# Patient Record
Sex: Male | Born: 1950 | Race: White | Hispanic: No | Marital: Married | Smoking: Never smoker
Health system: Southern US, Community
[De-identification: ages and names within clinical notes are randomized; demographics above are authoritative.]

## PROBLEM LIST (undated history)

## (undated) DIAGNOSIS — B001 Herpesviral vesicular dermatitis: Secondary | ICD-10-CM

## (undated) DIAGNOSIS — K219 Gastro-esophageal reflux disease without esophagitis: Secondary | ICD-10-CM

## (undated) DIAGNOSIS — E119 Type 2 diabetes mellitus without complications: Secondary | ICD-10-CM

## (undated) DIAGNOSIS — I10 Essential (primary) hypertension: Secondary | ICD-10-CM

## (undated) DIAGNOSIS — I739 Peripheral vascular disease, unspecified: Secondary | ICD-10-CM

## (undated) DIAGNOSIS — I2510 Atherosclerotic heart disease of native coronary artery without angina pectoris: Secondary | ICD-10-CM

## (undated) DIAGNOSIS — M109 Gout, unspecified: Secondary | ICD-10-CM

## (undated) DIAGNOSIS — M1990 Unspecified osteoarthritis, unspecified site: Secondary | ICD-10-CM

## (undated) DIAGNOSIS — R945 Abnormal results of liver function studies: Secondary | ICD-10-CM

## (undated) DIAGNOSIS — K7460 Unspecified cirrhosis of liver: Secondary | ICD-10-CM

## (undated) DIAGNOSIS — R7989 Other specified abnormal findings of blood chemistry: Secondary | ICD-10-CM

## (undated) DIAGNOSIS — E785 Hyperlipidemia, unspecified: Secondary | ICD-10-CM

## (undated) HISTORY — PX: CHOLECYSTECTOMY: SHX55

## (undated) HISTORY — PX: COLONOSCOPY: SHX174

## (undated) HISTORY — DX: Gastro-esophageal reflux disease without esophagitis: K21.9

## (undated) HISTORY — PX: ROTATOR CUFF REPAIR: SHX139

---

## 1998-11-14 ENCOUNTER — Ambulatory Visit (HOSPITAL_COMMUNITY): Admission: RE | Admit: 1998-11-14 | Discharge: 1998-11-14 | Payer: Self-pay | Admitting: Orthopedic Surgery

## 1998-11-14 ENCOUNTER — Encounter: Payer: Self-pay | Admitting: Orthopedic Surgery

## 2008-01-18 ENCOUNTER — Emergency Department: Payer: Self-pay | Admitting: Emergency Medicine

## 2013-04-29 ENCOUNTER — Emergency Department: Payer: Self-pay | Admitting: Unknown Physician Specialty

## 2013-05-02 ENCOUNTER — Encounter: Payer: Self-pay | Admitting: Family Medicine

## 2013-05-10 ENCOUNTER — Encounter: Payer: Self-pay | Admitting: Family Medicine

## 2013-06-09 ENCOUNTER — Encounter: Payer: Self-pay | Admitting: Family Medicine

## 2013-10-09 ENCOUNTER — Ambulatory Visit: Payer: Self-pay | Admitting: Podiatry

## 2014-09-05 DIAGNOSIS — R945 Abnormal results of liver function studies: Secondary | ICD-10-CM | POA: Insufficient documentation

## 2014-09-05 DIAGNOSIS — I10 Essential (primary) hypertension: Secondary | ICD-10-CM | POA: Insufficient documentation

## 2014-09-05 DIAGNOSIS — E781 Pure hyperglyceridemia: Secondary | ICD-10-CM | POA: Insufficient documentation

## 2014-09-05 DIAGNOSIS — R7989 Other specified abnormal findings of blood chemistry: Secondary | ICD-10-CM | POA: Insufficient documentation

## 2014-09-05 DIAGNOSIS — K219 Gastro-esophageal reflux disease without esophagitis: Secondary | ICD-10-CM | POA: Insufficient documentation

## 2014-09-05 DIAGNOSIS — E1122 Type 2 diabetes mellitus with diabetic chronic kidney disease: Secondary | ICD-10-CM | POA: Insufficient documentation

## 2014-09-23 DIAGNOSIS — I739 Peripheral vascular disease, unspecified: Secondary | ICD-10-CM | POA: Insufficient documentation

## 2015-04-01 ENCOUNTER — Ambulatory Visit
Admit: 2015-04-01 | Disposition: A | Discharge status: Home / Self Care | Payer: Self-pay | Attending: Gastroenterology | Admitting: Gastroenterology

## 2015-06-02 DIAGNOSIS — R7989 Other specified abnormal findings of blood chemistry: Secondary | ICD-10-CM | POA: Insufficient documentation

## 2017-03-10 HISTORY — PX: MOUTH SURGERY: SHX715

## 2017-03-11 ENCOUNTER — Encounter: Payer: Self-pay | Admitting: Emergency Medicine

## 2017-03-11 ENCOUNTER — Emergency Department
Admission: EM | Admit: 2017-03-11 | Discharge: 2017-03-11 | Disposition: A | Discharge status: Home / Self Care | Payer: Medicare Other | Attending: Emergency Medicine | Admitting: Emergency Medicine

## 2017-03-11 ENCOUNTER — Emergency Department: Payer: Medicare Other

## 2017-03-11 DIAGNOSIS — R739 Hyperglycemia, unspecified: Secondary | ICD-10-CM

## 2017-03-11 DIAGNOSIS — Z87891 Personal history of nicotine dependence: Secondary | ICD-10-CM | POA: Insufficient documentation

## 2017-03-11 DIAGNOSIS — Z79899 Other long term (current) drug therapy: Secondary | ICD-10-CM | POA: Diagnosis not present

## 2017-03-11 DIAGNOSIS — R1032 Left lower quadrant pain: Secondary | ICD-10-CM | POA: Diagnosis present

## 2017-03-11 DIAGNOSIS — N200 Calculus of kidney: Secondary | ICD-10-CM | POA: Diagnosis not present

## 2017-03-11 DIAGNOSIS — E1165 Type 2 diabetes mellitus with hyperglycemia: Secondary | ICD-10-CM | POA: Diagnosis not present

## 2017-03-11 DIAGNOSIS — Z7984 Long term (current) use of oral hypoglycemic drugs: Secondary | ICD-10-CM | POA: Diagnosis not present

## 2017-03-11 DIAGNOSIS — N23 Unspecified renal colic: Secondary | ICD-10-CM

## 2017-03-11 DIAGNOSIS — I10 Essential (primary) hypertension: Secondary | ICD-10-CM | POA: Diagnosis not present

## 2017-03-11 HISTORY — DX: Type 2 diabetes mellitus without complications: E11.9

## 2017-03-11 HISTORY — DX: Essential (primary) hypertension: I10

## 2017-03-11 LAB — COMPREHENSIVE METABOLIC PANEL
ALT: 80 U/L — AB (ref 17–63)
AST: 63 U/L — ABNORMAL HIGH (ref 15–41)
Albumin: 4.7 g/dL (ref 3.5–5.0)
Alkaline Phosphatase: 99 U/L (ref 38–126)
Anion gap: 9 (ref 5–15)
BILIRUBIN TOTAL: 0.7 mg/dL (ref 0.3–1.2)
BUN: 34 mg/dL — AB (ref 6–20)
CHLORIDE: 105 mmol/L (ref 101–111)
CO2: 21 mmol/L — ABNORMAL LOW (ref 22–32)
CREATININE: 1.07 mg/dL (ref 0.61–1.24)
Calcium: 9.5 mg/dL (ref 8.9–10.3)
Glucose, Bld: 259 mg/dL — ABNORMAL HIGH (ref 65–99)
Potassium: 5.2 mmol/L — ABNORMAL HIGH (ref 3.5–5.1)
Sodium: 135 mmol/L (ref 135–145)
TOTAL PROTEIN: 7.5 g/dL (ref 6.5–8.1)

## 2017-03-11 LAB — CBC
HCT: 42.5 % (ref 40.0–52.0)
Hemoglobin: 15 g/dL (ref 13.0–18.0)
MCH: 32.8 pg (ref 26.0–34.0)
MCHC: 35.2 g/dL (ref 32.0–36.0)
MCV: 93.2 fL (ref 80.0–100.0)
PLATELETS: 124 10*3/uL — AB (ref 150–440)
RBC: 4.56 MIL/uL (ref 4.40–5.90)
RDW: 12.6 % (ref 11.5–14.5)
WBC: 8 10*3/uL (ref 3.8–10.6)

## 2017-03-11 LAB — URINALYSIS, COMPLETE (UACMP) WITH MICROSCOPIC
BILIRUBIN URINE: NEGATIVE
Bacteria, UA: NONE SEEN
KETONES UR: NEGATIVE mg/dL
LEUKOCYTES UA: NEGATIVE
NITRITE: NEGATIVE
PH: 5 (ref 5.0–8.0)
Protein, ur: NEGATIVE mg/dL
SPECIFIC GRAVITY, URINE: 1.018 (ref 1.005–1.030)
SQUAMOUS EPITHELIAL / LPF: NONE SEEN

## 2017-03-11 LAB — LIPASE, BLOOD: LIPASE: 66 U/L — AB (ref 11–51)

## 2017-03-11 MED ORDER — ONDANSETRON 4 MG PO TBDP: 4.0000 mg | ORAL_TABLET | Freq: Three times a day (TID) | ORAL | 0 refills | Status: DC | PRN

## 2017-03-11 MED ORDER — TAMSULOSIN HCL 0.4 MG PO CAPS: 0.4000 mg | ORAL_CAPSULE | Freq: Every day | ORAL | 0 refills | Status: DC

## 2017-03-11 MED ORDER — OXYCODONE-ACETAMINOPHEN 7.5-325 MG PO TABS: 1.0000 | ORAL_TABLET | ORAL | 0 refills | Status: DC | PRN

## 2017-03-11 MED ORDER — GLIPIZIDE ER 10 MG PO TB24: 10.0000 mg | ORAL_TABLET | Freq: Every day | ORAL | 2 refills | Status: DC

## 2017-03-11 NOTE — ED Notes (Signed)
Patient is complaining of left lower quadrant pain radiating into left groin that began at 3am last night and woke patient from his sleep.  Patient is in no obvious distress at this time.

## 2017-03-11 NOTE — ED Provider Notes (Signed)
[facility] Emergency Department Provider Note       Time seen: ----------------------------------------- 11:38 AM on 03/11/2017 -----------------------------------------     I have reviewed the triage vital signs and the nursing notes.   HISTORY   Chief Complaint Abdominal Pain    HPI Gregory Cruz. is a 66 y.o. male who presents to the ED for left lower quadrant pain since this morning. Patient has never had it before, nothing makes it better or worse. Current pain is 6 out of 10 in the left lower quadrant. He denies fevers, chills, vomiting or diarrhea. Also denies constipation.   Past Medical History:  Diagnosis Date  . Diabetes mellitus without complication (HCC)   . Hypertension     There are no active problems to display for this patient.   Past Surgical History:  Procedure Laterality Date  . ROTATOR CUFF REPAIR     bilateral    Allergies Patient has no known allergies.  Social History Social History  Substance Use Topics  . Smoking status: Former Games developer  . Smokeless tobacco: Never Used  . Alcohol use No    Review of Systems Constitutional: Negative for fever. Cardiovascular: Negative for chest pain. Respiratory: Negative for shortness of breath. Gastrointestinal: Positive for abdominal pain Genitourinary: Negative for dysuria. Musculoskeletal: Negative for back pain. Skin: Negative for rash. Neurological: Negative for headaches, focal weakness or numbness.  10-point ROS otherwise negative.  ____________________________________________   PHYSICAL EXAM:  VITAL SIGNS: ED Triage Vitals  Enc Vitals Group     BP 03/11/17 0843 (!) 141/84     Pulse Rate 03/11/17 0843 79     Resp 03/11/17 0843 20     Temp 03/11/17 0843 98.7 F (37.1 C)     Temp Source 03/11/17 0843 Oral     SpO2 03/11/17 0843 98 %     Weight 03/11/17 0844 200 lb (90.7 kg)     Height 03/11/17 0844 6' (1.829 m)     Head Circumference --    Peak Flow --      Pain Score 03/11/17 0843 6     Pain Loc --      Pain Edu? --      Excl. in GC? --     Constitutional: Alert and oriented. Well appearing and in no distress. Eyes: Conjunctivae are normal. PERRL. Normal extraocular movements. ENT   Head: Normocephalic and atraumatic.   Nose: No congestion/rhinnorhea.   Mouth/Throat: Mucous membranes are moist.   Neck: No stridor. Cardiovascular: Normal rate, regular rhythm. No murmurs, rubs, or gallops. Respiratory: Normal respiratory effort without tachypnea nor retractions. Breath sounds are clear and equal bilaterally. No wheezes/rales/rhonchi. Gastrointestinal: Left lower quadrant tenderness, no rebound or guarding. Normal bowel sounds. Musculoskeletal: Nontender with normal range of motion in extremities. No lower extremity tenderness nor edema. Neurologic:  Normal speech and language. No gross focal neurologic deficits are appreciated.  Skin:  Skin is warm, dry and intact. No rash noted. Psychiatric: Mood and affect are normal. Speech and behavior are normal.  ____________________________________________  ED COURSE:  Pertinent labs & imaging results that were available during my care of the patient were reviewed by me and considered in my medical decision making (see chart for details). Patient presents for leg pain, we will assess with labs and imaging as indicated.   Procedures ____________________________________________   LABS (pertinent positives/negatives)  Labs Reviewed  LIPASE, BLOOD - Abnormal; Notable for the following:       Result Value  Lipase 66 (*)    All other components within normal limits  COMPREHENSIVE METABOLIC PANEL - Abnormal; Notable for the following:    Potassium 5.2 (*)    CO2 21 (*)    Glucose, Bld 259 (*)    BUN 34 (*)    AST 63 (*)    ALT 80 (*)    All other components within normal limits  CBC - Abnormal; Notable for the following:    Platelets 124 (*)    All other  components within normal limits  URINALYSIS, COMPLETE (UACMP) WITH MICROSCOPIC - Abnormal; Notable for the following:    Color, Urine YELLOW (*)    APPearance CLEAR (*)    Glucose, UA >=500 (*)    Hgb urine dipstick LARGE (*)    All other components within normal limits    RADIOLOGY Images were viewed by me  CT renal protocol IMPRESSION: 3 mm proximal left ureteral calculus at the UPJ knee. Associated mild left hydronephrosis with mild perirenal edema.  Two additional nonobstructing left lower pole renal calculi measuring up to 3 mm.  Suspected cirrhosis.  3.6 x 4.3 cm hypodense lesion in segment 7 of the liver, incompletely characterized on the current CT. However, by report, a 4.0 cm benign hemangioma was present in this location on 2003 CT, supporting a benign etiology. ____________________________________________  FINAL ASSESSMENT AND PLAN  Flank pain, Renal colic, hyperglycemia  Plan: Patient's labs and imaging were dictated above. Patient had presented for flank pain which appears to be secondary to a 3 mm left-sided ureteral calculus. He'll be discharged with Flomax, Percocet and Zofran. He'll be referred to urology for outpatient follow-up. We will also make adjustments to his glipizide   Emily Filbert, MD   Note: This note was generated in part or whole with voice recognition software. Voice recognition is usually quite accurate but there are transcription errors that can and very often do occur. I apologize for any typographical errors that were not detected and corrected.     Emily Filbert, MD 03/11/17 [phone]

## 2017-03-11 NOTE — ED Triage Notes (Signed)
Pt with LLQ pain this am with no other sx.

## 2017-05-28 DIAGNOSIS — I700 Atherosclerosis of aorta: Secondary | ICD-10-CM | POA: Insufficient documentation

## 2017-06-11 ENCOUNTER — Inpatient Hospital Stay: Payer: Medicare Other

## 2017-06-11 ENCOUNTER — Encounter: Payer: Self-pay | Admitting: Oncology

## 2017-06-11 ENCOUNTER — Inpatient Hospital Stay: Payer: Medicare Other | Attending: Oncology | Admitting: Oncology

## 2017-06-11 VITALS — BP 122/83 | HR 67 | Temp 97.3°F | Resp 18 | Ht 72.0 in | Wt 189.0 lb

## 2017-06-11 DIAGNOSIS — E1121 Type 2 diabetes mellitus with diabetic nephropathy: Secondary | ICD-10-CM | POA: Insufficient documentation

## 2017-06-11 DIAGNOSIS — E785 Hyperlipidemia, unspecified: Secondary | ICD-10-CM | POA: Insufficient documentation

## 2017-06-11 DIAGNOSIS — R7989 Other specified abnormal findings of blood chemistry: Secondary | ICD-10-CM | POA: Diagnosis not present

## 2017-06-11 DIAGNOSIS — I10 Essential (primary) hypertension: Secondary | ICD-10-CM | POA: Insufficient documentation

## 2017-06-11 DIAGNOSIS — Z7984 Long term (current) use of oral hypoglycemic drugs: Secondary | ICD-10-CM | POA: Insufficient documentation

## 2017-06-11 DIAGNOSIS — K219 Gastro-esophageal reflux disease without esophagitis: Secondary | ICD-10-CM | POA: Diagnosis not present

## 2017-06-11 DIAGNOSIS — K769 Liver disease, unspecified: Secondary | ICD-10-CM

## 2017-06-11 DIAGNOSIS — Z87442 Personal history of urinary calculi: Secondary | ICD-10-CM | POA: Diagnosis not present

## 2017-06-11 DIAGNOSIS — K7460 Unspecified cirrhosis of liver: Secondary | ICD-10-CM | POA: Insufficient documentation

## 2017-06-11 LAB — COMPREHENSIVE METABOLIC PANEL
ALK PHOS: 114 U/L (ref 38–126)
ALT: 106 U/L — ABNORMAL HIGH (ref 17–63)
ANION GAP: 9 (ref 5–15)
AST: 80 U/L — ABNORMAL HIGH (ref 15–41)
Albumin: 5 g/dL (ref 3.5–5.0)
BILIRUBIN TOTAL: 0.9 mg/dL (ref 0.3–1.2)
BUN: 24 mg/dL — ABNORMAL HIGH (ref 6–20)
CALCIUM: 10.5 mg/dL — AB (ref 8.9–10.3)
CO2: 22 mmol/L (ref 22–32)
Chloride: 102 mmol/L (ref 101–111)
Creatinine, Ser: 0.8 mg/dL (ref 0.61–1.24)
Glucose, Bld: 164 mg/dL — ABNORMAL HIGH (ref 65–99)
Potassium: 4.7 mmol/L (ref 3.5–5.1)
SODIUM: 133 mmol/L — AB (ref 135–145)
TOTAL PROTEIN: 7.8 g/dL (ref 6.5–8.1)

## 2017-06-11 LAB — CBC WITH DIFFERENTIAL/PLATELET
BASOS ABS: 0.6 10*3/uL — AB (ref 0–0.1)
BASOS PCT: 7 %
EOS ABS: 0.3 10*3/uL (ref 0–0.7)
Eosinophils Relative: 3 %
HEMATOCRIT: 44.1 % (ref 40.0–52.0)
HEMOGLOBIN: 15.7 g/dL (ref 13.0–18.0)
Lymphocytes Relative: 18 %
Lymphs Abs: 1.5 10*3/uL (ref 1.0–3.6)
MCH: 33.3 pg (ref 26.0–34.0)
MCHC: 35.7 g/dL (ref 32.0–36.0)
MCV: 93.2 fL (ref 80.0–100.0)
Monocytes Absolute: 0.4 10*3/uL (ref 0.2–1.0)
Monocytes Relative: 5 %
NEUTROS ABS: 6 10*3/uL (ref 1.4–6.5)
NEUTROS PCT: 67 %
Platelets: 168 10*3/uL (ref 150–440)
RBC: 4.73 MIL/uL (ref 4.40–5.90)
RDW: 12.7 % (ref 11.5–14.5)
WBC: 8.9 10*3/uL (ref 3.8–10.6)

## 2017-06-11 LAB — IRON AND TIBC
IRON: 104 ug/dL (ref 45–182)
Saturation Ratios: 34 % (ref 17.9–39.5)
TIBC: 304 ug/dL (ref 250–450)
UIBC: 200 ug/dL

## 2017-06-11 LAB — FERRITIN: FERRITIN: 1807 ng/mL — AB (ref 24–336)

## 2017-06-11 NOTE — Progress Notes (Signed)
Patient here today as new evaluation regarding elevated ferritin.  Referred by Dr. Dareen PianoAnderson.

## 2017-06-13 ENCOUNTER — Encounter: Payer: Self-pay | Admitting: Oncology

## 2017-06-13 NOTE — Progress Notes (Signed)
Hematology/Oncology Consult note [facility] Telephone:([phone] Fax:([phone]) [phone]  Patient Care Team: Lauro Regulus, MD as PCP - General (Internal Medicine)   Name of the patient: Gregory Cruz  [id number]  05-Feb-1951    Reason for referral- elevated ferritin   Referring physician- Dr. 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 He has been referred to [place] for elevated ferritin. His recent bloodwork from 05/21/2017 was as follows CMP showed elevated blood sugar of 211, elevated AST and ALT of 54 and 87 respectively. He has been seen by [facility] clinic gastroenterology back in 2016 for elevated liver function tests as well but has not followed up with them since then. Most recent ferritin I have for him is from January 2017 which was elevated at 1010. In April 2016 his ferritin was 918. Hepatitis C testing in 2017 was negative. Recent TSH on February 2018 was normal. Recent CBC from July 2017 showed white count of 7.6, H&H of 16.6/46.3 with an MCV of 91.3 and a platelet count of 147.  Patient denies any alcohol use. Denies any unintentional weight loss. No family history of any blood disorders such as thalassemia. No history of any blood transfusions. 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 He also has a known hypodense lesion in his liver which was stable as compared to 2003 and compatible with a benign hemangioma. No splenomegaly or adenopathy  ECOG PS- 0  Pain scale- 0   Review of systems- Review of Systems  Constitutional: Positive for malaise/fatigue. Negative for chills, fever and weight loss.  HENT: Negative for congestion, ear discharge and nosebleeds.   Eyes: Negative  for blurred vision.  Respiratory: Negative for cough, hemoptysis, sputum production, shortness of breath and wheezing.   Cardiovascular: Negative for chest pain, palpitations, orthopnea and claudication.  Gastrointestinal: Negative for abdominal pain, blood in stool, constipation, diarrhea, heartburn, melena, nausea and vomiting.  Genitourinary: Negative for dysuria, flank pain, frequency, hematuria and urgency.  Musculoskeletal: Negative for back pain, joint pain and myalgias.  Skin: Negative for rash.  Neurological: Positive for tingling. Negative for dizziness, focal weakness, seizures, weakness and headaches.  Endo/Heme/Allergies: Does not bruise/bleed easily.  Psychiatric/Behavioral: Negative for depression and suicidal ideas. The patient does not have insomnia.     Allergies  Allergen Reactions  . Bee Venom Swelling  . Empagliflozin Other (See Comments)    Frequent urination    There are no active problems to display for this patient.    Past Medical History:  Diagnosis Date  . Diabetes mellitus without complication (HCC)   . GERD (gastroesophageal reflux disease)   . Hypertension      Past Surgical History:  Procedure Laterality Date  . CHOLECYSTECTOMY    . MOUTH SURGERY  03/2017  . ROTATOR CUFF REPAIR     bilateral    Social History   Social History  . Marital status: Married    Spouse name: N/A  . Number of children: N/A  . Years of education: N/A   Occupational History  . Not on file.   Social History Main Topics  . Smoking status: Never Smoker  . Smokeless tobacco: Never Used  . Alcohol use No  . Drug use: No  . Sexual activity: Not on  file   Other Topics Concern  . Not on file   Social History Narrative  . No narrative on file     No family history on file.   Current Outpatient Prescriptions:  .  atorvastatin (LIPITOR) 20 MG tablet, Take 20 mg by mouth daily., Disp: , Rfl:  .  glipiZIDE (GLUCOTROL XL) 10 MG 24 hr tablet, Take 1 tablet  (10 mg total) by mouth daily with breakfast., Disp: 30 tablet, Rfl: 2 .  ibuprofen (ADVIL,MOTRIN) 200 MG tablet, Take 200 mg by mouth every 6 (six) hours as needed., Disp: , Rfl:  .  lisinopril (PRINIVIL,ZESTRIL) 20 MG tablet, Take 20 mg by mouth daily., Disp: , Rfl:  .  sitaGLIPtin-metformin (JANUMET) 50-500 MG tablet, Take 1 tablet by mouth 2 (two) times daily., Disp: , Rfl:  .  ondansetron (ZOFRAN ODT) 4 MG disintegrating tablet, Take 1 tablet (4 mg total) by mouth every 8 (eight) hours as needed for nausea or vomiting. (Patient not taking: Reported on 06/11/2017), Disp: 20 tablet, Rfl: 0 .  oxyCODONE-acetaminophen (PERCOCET) 7.5-325 MG tablet, Take 1 tablet by mouth every 4 (four) hours as needed for severe pain. (Patient not taking: Reported on 06/11/2017), Disp: 20 tablet, Rfl: 0 .  tamsulosin (FLOMAX) 0.4 MG CAPS capsule, Take 1 capsule (0.4 mg total) by mouth daily after breakfast. (Patient not taking: Reported on 06/11/2017), Disp: 30 capsule, Rfl: 0   Physical exam:  Vitals:   06/11/17 1418  BP: 122/83  Pulse: 67  Resp: 18  Temp: (!) 97.3 F (36.3 C)  TempSrc: Tympanic  Weight: 189 lb (85.7 kg)  Height: 6' (1.829 m)   Physical Exam  Constitutional: He is oriented to person, place, and time and well-developed, well-nourished, and in no distress.  HENT:  Head: Normocephalic and atraumatic.  Eyes: EOM are normal. Pupils are equal, round, and reactive to light.  Neck: Normal range of motion.  Cardiovascular: Normal rate, regular rhythm and normal heart sounds.   Pulmonary/Chest: Effort normal and breath sounds normal.  Abdominal: Soft. Bowel sounds are normal.  No palpable splenomegaly  Neurological: He is alert and oriented to person, place, and time.  Skin: Skin is warm and dry.       CMP Latest Ref Rng & Units 06/11/2017  Glucose 65 - 99 mg/dL 161(W164(H)  BUN 6 - 20 mg/dL 96(E24(H)  Creatinine 4.540.61 - 1.24 mg/dL 0.980.80  Sodium 119135 - 147145 mmol/L 133(L)  Potassium 3.5 - 5.1 mmol/L 4.7    Chloride 101 - 111 mmol/L 102  CO2 22 - 32 mmol/L 22  Calcium 8.9 - 10.3 mg/dL 10.5(H)  Total Protein 6.5 - 8.1 g/dL 7.8  Total Bilirubin 0.3 - 1.2 mg/dL 0.9  Alkaline Phos 38 - 126 U/L 114  AST 15 - 41 U/L 80(H)  ALT 17 - 63 U/L 106(H)   CBC Latest Ref Rng & Units 06/11/2017  WBC 3.8 - 10.6 K/uL 8.9  Hemoglobin 13.0 - 18.0 g/dL 82.915.7  Hematocrit 56.240.0 - 52.0 % 44.1  Platelets 150 - 440 K/uL 168   Assessment and plan- Patient is a 66 y.o. male referred for elevated ferritin  I suspect that patient's elevated ferritin is due to his liver disease and may have a component of insulin resistance hepatic iron overload. Today I will repeat a CBC with differential, CMP, ferritin and iron studies. If his transferrin saturation is less than 45% I do not suspect as a cause of elevated ferritin. I think he would then dissected from phlebotomy  given his elevated ferritin and abnormal LFTs. Given that he has a normal hemoglobin with a normal MCV he does not have thalassemia that can lead to elevated ferritin.    However I would like to refer him to [facility] GI who he has seen in the past for abnormal LFTs given the evidence of cirrhosis as well as persistent abnormal LFTs at this time    I will see the patient back in 1 week's time to discuss the results of his blood work and further management and initiate phlebotomy at that time   Thank you for this kind referral and the opportunity to participate in the care of this patient   Visit Diagnosis 1. Elevated ferritin     Dr. Owens Shark, MD, MPH [facility] Pager- [phone] 06/13/2017

## 2017-06-18 ENCOUNTER — Encounter: Payer: Self-pay | Admitting: Oncology

## 2017-06-18 ENCOUNTER — Inpatient Hospital Stay: Payer: Medicare Other

## 2017-06-18 ENCOUNTER — Inpatient Hospital Stay (HOSPITAL_BASED_OUTPATIENT_CLINIC_OR_DEPARTMENT_OTHER): Payer: Medicare Other | Admitting: Oncology

## 2017-06-18 VITALS — BP 129/83 | HR 69

## 2017-06-18 VITALS — BP 132/84 | HR 73 | Temp 96.4°F | Resp 18 | Wt 189.4 lb

## 2017-06-18 DIAGNOSIS — K7460 Unspecified cirrhosis of liver: Secondary | ICD-10-CM | POA: Diagnosis not present

## 2017-06-18 DIAGNOSIS — K769 Liver disease, unspecified: Secondary | ICD-10-CM | POA: Diagnosis not present

## 2017-06-18 DIAGNOSIS — R7989 Other specified abnormal findings of blood chemistry: Secondary | ICD-10-CM | POA: Diagnosis not present

## 2017-06-18 DIAGNOSIS — E8881 Metabolic syndrome: Secondary | ICD-10-CM

## 2017-06-18 MED ORDER — SODIUM CHLORIDE 0.9 % IV SOLN
Freq: Once | INTRAVENOUS | Status: AC
  Administered 2017-06-18: 10:00:00 via INTRAVENOUS
  Filled 2017-06-18: qty 1000

## 2017-06-18 NOTE — Progress Notes (Signed)
Patient received 500 ml phlebotomy and 500 ml fluid replacement of normal saline as ordered per Dr. Smith Robertao.

## 2017-06-18 NOTE — Progress Notes (Signed)
Here for follow up

## 2017-06-18 NOTE — Progress Notes (Signed)
Hematology/Oncology Consult note [facility]  Telephone:([phone] Fax:([phone]  Patient Care Team: Lauro RegulusAnderson, [place] W, MD as PCP - General (Internal Medicine)   Name of the patient: Gregory MountsClifford Cruz  [id number]  [birth year]   Date of visit: 06/18/17  Diagnosis- elevated ferritin likely due to insulin resistance hepatic iron overload  Chief complaint/ Reason for visit- discuss results of bloodwork  Heme/Onc history: patient is a 66 year old male with a past medical history significant for hypertension and hyperlipidemia and 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 He has been referred to us for elevated ferritin. His recent bloodwork from 05/21/2017 was as follows CMP showed elevated blood sugar of 211, elevated AST and ALT of 54 and 87 respectively. [facility] in 2016 for elevated liver function tests as well but has not followed up with them since then. Most recent ferritin I have for him is from January 2017 which was elevated at 1010. In April 2016 his ferritin was 918. Hepatitis C testing in 2017 was negative. Recent TSH on February 2018 was normal. Recent CBC from July 2017 showed white count of 7.6, H&H of 16.6/46.3 with an MCV of 91.3 and a platelet count of 147.  Patient denies any alcohol use. Denies any unintentional weight loss. No family history of any blood disorders such as thalassemia. No history of any blood transfusions. 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 He also has a known hypodense lesion in his liver which was stable as compared to 2003 and compatible with a benign hemangioma. No splenomegaly or adenopathy  Results of bloodwork from 03-2017 breast follows: CBC showed white count of 8.9, H&H of 15.7/44.1 and a platelet count of 168.CMP was significant for elevated AST and ALT of 1806, elevated  blood sugar of 164 total bilirubin was normal.ferritin levels were elevated at 1807. Iron studies showed iron saturation of 34%. Serum iron was normal at 104 and TIBC was normal at 304  Interval history- doing well. Denies any complaints  ECOG PS- 0 Pain scale- 0   Review of systems- Review of Systems  Constitutional: Negative for chills, fever, malaise/fatigue and weight loss.  HENT: Negative for congestion, ear discharge and nosebleeds.   Eyes: Negative for blurred vision.  Respiratory: Negative for cough, hemoptysis, sputum production, shortness of breath and wheezing.   Cardiovascular: Negative for chest pain, palpitations, orthopnea and claudication.  Gastrointestinal: Negative for abdominal pain, blood in stool, constipation, diarrhea, heartburn, melena, nausea and vomiting.  Genitourinary: Negative for dysuria, flank pain, frequency, hematuria and urgency.  Musculoskeletal: Negative for back pain, joint pain and myalgias.  Skin: Negative for rash.  Neurological: Negative for dizziness, tingling, focal weakness, seizures, weakness and headaches.  Endo/Heme/Allergies: Does not bruise/bleed easily.  Psychiatric/Behavioral: Negative for depression and suicidal ideas. The patient does not have insomnia.       Allergies  Allergen Reactions  . Bee Venom Swelling  . Empagliflozin Other (See Comments)    Frequent urination     Past Medical History:  Diagnosis Date  . Diabetes mellitus without complication (HCC)   . GERD (gastroesophageal reflux disease)   . Hypertension      Past Surgical History:  Procedure Laterality Date  . CHOLECYSTECTOMY    . MOUTH SURGERY  03/2017  . ROTATOR CUFF REPAIR     bilateral    Social History   Social History  .  Marital status: Married    Spouse name: N/A  . Number of children: N/A  . Years of education: N/A   Occupational History  . Not on file.   Social History Main Topics  . Smoking status: Never Smoker  . Smokeless  tobacco: Never Used  . Alcohol use No  . Drug use: No  . Sexual activity: Not on file   Other Topics Concern  . Not on file   Social History Narrative  . No narrative on file    No family history on file.   Current Outpatient Prescriptions:  .  atorvastatin (LIPITOR) 20 MG tablet, Take 20 mg by mouth daily., Disp: , Rfl:  .  glipiZIDE (GLUCOTROL XL) 10 MG 24 hr tablet, Take 1 tablet (10 mg total) by mouth daily with breakfast., Disp: 30 tablet, Rfl: 2 .  ibuprofen (ADVIL,MOTRIN) 200 MG tablet, Take 200 mg by mouth every 6 (six) hours as needed., Disp: , Rfl:  .  lisinopril (PRINIVIL,ZESTRIL) 20 MG tablet, Take 20 mg by mouth daily., Disp: , Rfl:  .  ondansetron (ZOFRAN ODT) 4 MG disintegrating tablet, Take 1 tablet (4 mg total) by mouth every 8 (eight) hours as needed for nausea or vomiting. (Patient not taking: Reported on 06/11/2017), Disp: 20 tablet, Rfl: 0 .  oxyCODONE-acetaminophen (PERCOCET) 7.5-325 MG tablet, Take 1 tablet by mouth every 4 (four) hours as needed for severe pain. (Patient not taking: Reported on 06/11/2017), Disp: 20 tablet, Rfl: 0 .  sitaGLIPtin-metformin (JANUMET) 50-500 MG tablet, Take 1 tablet by mouth 2 (two) times daily., Disp: , Rfl:  .  tamsulosin (FLOMAX) 0.4 MG CAPS capsule, Take 1 capsule (0.4 mg total) by mouth daily after breakfast. (Patient not taking: Reported on 06/11/2017), Disp: 30 capsule, Rfl: 0  Physical exam:  Vitals:   06/18/17 0856  BP: 132/84  Pulse: 73  Resp: 18  Temp: (!) 96.4 F (35.8 C)  TempSrc: Tympanic  Weight: 189 lb 6.4 oz (85.9 kg)   Physical Exam  Constitutional: He is oriented to person, place, and time and well-developed, well-nourished, and in no distress.  HENT:  Head: Normocephalic and atraumatic.  Eyes: EOM are normal. Pupils are equal, round, and reactive to light.  Neck: Normal range of motion.  Cardiovascular: Normal rate, regular rhythm and normal heart sounds.   Pulmonary/Chest: Effort normal and breath sounds  normal.  Abdominal: Soft. Bowel sounds are normal.  Neurological: He is alert and oriented to person, place, and time.  Skin: Skin is warm and dry.     CMP Latest Ref Rng & Units 06/11/2017  Glucose 65 - 99 mg/dL 161(W)  BUN 6 - 20 mg/dL 96(E)  Creatinine 4.54 - 1.24 mg/dL 0.98  Sodium 119 - 147 mmol/L 133(L)  Potassium 3.5 - 5.1 mmol/L 4.7  Chloride 101 - 111 mmol/L 102  CO2 22 - 32 mmol/L 22  Calcium 8.9 - 10.3 mg/dL 10.5(H)  Total Protein 6.5 - 8.1 g/dL 7.8  Total Bilirubin 0.3 - 1.2 mg/dL 0.9  Alkaline Phos 38 - 126 U/L 114  AST 15 - 41 U/L 80(H)  ALT 17 - 63 U/L 106(H)   CBC Latest Ref Rng & Units 06/11/2017  WBC 3.8 - 10.6 K/uL 8.9  Hemoglobin 13.0 - 18.0 g/dL 82.9  Hematocrit 56.2 - 52.0 % 44.1  Platelets 150 - 440 K/uL 168      Assessment and plan- Patient is a 66 y.o. male referred for elevated ferritin likely due to insulin resistance hepatic iron overload.  Discussed results of bloodwork with the patient. He has uncontrolled diabetes along with abnormal LFT's and elevated ferritin suggestive of insulin resistance hepatic iron overload. Given that his transferrin saturation is <45%, he likely does not have hemochromatosis. Ferritin levels are significantly elevated and I would like to do weekly phlebotomy until ferritin levels are <50 if patient can tolerate it.  He also needs to f/u with [facility] regarding his abnormal LFT's  Patient will continue weekly phlebotomy (500 cc replaced with 500 cc normal saline) and I will see him back in 6 weeks. Ferritin to be checked every other week. He will need cbc checked each week and LFts at 3 weeks. In 6 weeks he will get cbc, cmp and will see me for possible phlebotomy. I will consider hemochromatosis testing that time if insurance approves.    Visit Diagnosis 1. Elevated ferritin level   2. Insulin resistance syndrome      Dr. Owens Shark, MD, MPH [facility] Pager-  [phone] 06/18/2017 11:16 AM

## 2017-06-24 ENCOUNTER — Inpatient Hospital Stay: Payer: Medicare Other

## 2017-06-24 DIAGNOSIS — R7989 Other specified abnormal findings of blood chemistry: Secondary | ICD-10-CM | POA: Diagnosis not present

## 2017-06-24 LAB — CBC WITH DIFFERENTIAL/PLATELET
BASOS PCT: 1 %
Basophils Absolute: 0.1 10*3/uL (ref 0–0.1)
Eosinophils Absolute: 0.3 10*3/uL (ref 0–0.7)
Eosinophils Relative: 6 %
HEMATOCRIT: 38.3 % — AB (ref 40.0–52.0)
HEMOGLOBIN: 13.9 g/dL (ref 13.0–18.0)
LYMPHS ABS: 1.7 10*3/uL (ref 1.0–3.6)
Lymphocytes Relative: 32 %
MCH: 33.6 pg (ref 26.0–34.0)
MCHC: 36.2 g/dL — AB (ref 32.0–36.0)
MCV: 93 fL (ref 80.0–100.0)
MONO ABS: 0.4 10*3/uL (ref 0.2–1.0)
Monocytes Relative: 7 %
NEUTROS ABS: 3 10*3/uL (ref 1.4–6.5)
Neutrophils Relative %: 54 %
Platelets: 115 10*3/uL — ABNORMAL LOW (ref 150–440)
RBC: 4.12 MIL/uL — ABNORMAL LOW (ref 4.40–5.90)
RDW: 12.4 % (ref 11.5–14.5)
WBC: 5.4 10*3/uL (ref 3.8–10.6)

## 2017-06-24 LAB — FERRITIN: Ferritin: 967 ng/mL — ABNORMAL HIGH (ref 24–336)

## 2017-06-25 ENCOUNTER — Inpatient Hospital Stay: Payer: Medicare Other

## 2017-06-25 DIAGNOSIS — R7989 Other specified abnormal findings of blood chemistry: Secondary | ICD-10-CM

## 2017-06-25 NOTE — Progress Notes (Signed)
Pt refused IVF, Cordelia PenSherry, RN notified after Dr Smith Robertao could not be reached. Pt educated on increasing PO fluid intake, consumed 3 cups of water while here for treatment, pt verbalized understanding.

## 2017-07-01 ENCOUNTER — Inpatient Hospital Stay: Payer: Medicare Other

## 2017-07-01 DIAGNOSIS — R7989 Other specified abnormal findings of blood chemistry: Secondary | ICD-10-CM

## 2017-07-01 LAB — CBC WITH DIFFERENTIAL/PLATELET
BASOS PCT: 1 %
Basophils Absolute: 0 10*3/uL (ref 0–0.1)
EOS PCT: 7 %
Eosinophils Absolute: 0.4 10*3/uL (ref 0–0.7)
HEMATOCRIT: 37.2 % — AB (ref 40.0–52.0)
Hemoglobin: 13.3 g/dL (ref 13.0–18.0)
Lymphocytes Relative: 29 %
Lymphs Abs: 1.7 10*3/uL (ref 1.0–3.6)
MCH: 33.7 pg (ref 26.0–34.0)
MCHC: 35.7 g/dL (ref 32.0–36.0)
MCV: 94.3 fL (ref 80.0–100.0)
MONO ABS: 0.4 10*3/uL (ref 0.2–1.0)
MONOS PCT: 7 %
NEUTROS ABS: 3.3 10*3/uL (ref 1.4–6.5)
Neutrophils Relative %: 56 %
Platelets: 110 10*3/uL — ABNORMAL LOW (ref 150–440)
RBC: 3.95 MIL/uL — ABNORMAL LOW (ref 4.40–5.90)
RDW: 13.1 % (ref 11.5–14.5)
WBC: 5.9 10*3/uL (ref 3.8–10.6)

## 2017-07-01 LAB — FERRITIN: Ferritin: 956 ng/mL — ABNORMAL HIGH (ref 24–336)

## 2017-07-02 ENCOUNTER — Inpatient Hospital Stay: Payer: Medicare Other

## 2017-07-02 VITALS — BP 128/85 | HR 65 | Temp 98.0°F | Resp 18

## 2017-07-02 DIAGNOSIS — R7989 Other specified abnormal findings of blood chemistry: Secondary | ICD-10-CM | POA: Diagnosis not present

## 2017-07-08 ENCOUNTER — Inpatient Hospital Stay: Payer: Medicare Other

## 2017-07-08 DIAGNOSIS — R7989 Other specified abnormal findings of blood chemistry: Secondary | ICD-10-CM | POA: Diagnosis not present

## 2017-07-08 LAB — CBC WITH DIFFERENTIAL/PLATELET
BASOS ABS: 0.1 10*3/uL (ref 0–0.1)
Basophils Relative: 1 %
EOS ABS: 0.4 10*3/uL (ref 0–0.7)
EOS PCT: 6 %
HCT: 35.6 % — ABNORMAL LOW (ref 40.0–52.0)
Hemoglobin: 13 g/dL (ref 13.0–18.0)
Lymphocytes Relative: 23 %
Lymphs Abs: 1.7 10*3/uL (ref 1.0–3.6)
MCH: 33.9 pg (ref 26.0–34.0)
MCHC: 36.4 g/dL — ABNORMAL HIGH (ref 32.0–36.0)
MCV: 93.2 fL (ref 80.0–100.0)
Monocytes Absolute: 0.5 10*3/uL (ref 0.2–1.0)
Monocytes Relative: 7 %
Neutro Abs: 4.4 10*3/uL (ref 1.4–6.5)
Neutrophils Relative %: 63 %
PLATELETS: 122 10*3/uL — AB (ref 150–440)
RBC: 3.82 MIL/uL — AB (ref 4.40–5.90)
RDW: 13.1 % (ref 11.5–14.5)
WBC: 7.1 10*3/uL (ref 3.8–10.6)

## 2017-07-09 ENCOUNTER — Inpatient Hospital Stay: Payer: Medicare Other

## 2017-07-09 VITALS — BP 123/81 | HR 75 | Resp 16

## 2017-07-09 DIAGNOSIS — R7989 Other specified abnormal findings of blood chemistry: Secondary | ICD-10-CM

## 2017-07-15 ENCOUNTER — Inpatient Hospital Stay: Payer: Medicare Other | Attending: Oncology

## 2017-07-15 ENCOUNTER — Other Ambulatory Visit: Payer: Self-pay

## 2017-07-15 DIAGNOSIS — Z87442 Personal history of urinary calculi: Secondary | ICD-10-CM | POA: Insufficient documentation

## 2017-07-15 DIAGNOSIS — R7989 Other specified abnormal findings of blood chemistry: Secondary | ICD-10-CM | POA: Insufficient documentation

## 2017-07-15 DIAGNOSIS — Z7984 Long term (current) use of oral hypoglycemic drugs: Secondary | ICD-10-CM | POA: Diagnosis not present

## 2017-07-15 DIAGNOSIS — Z79899 Other long term (current) drug therapy: Secondary | ICD-10-CM | POA: Diagnosis not present

## 2017-07-15 DIAGNOSIS — K219 Gastro-esophageal reflux disease without esophagitis: Secondary | ICD-10-CM | POA: Diagnosis not present

## 2017-07-15 DIAGNOSIS — E1121 Type 2 diabetes mellitus with diabetic nephropathy: Secondary | ICD-10-CM | POA: Insufficient documentation

## 2017-07-15 DIAGNOSIS — E785 Hyperlipidemia, unspecified: Secondary | ICD-10-CM | POA: Diagnosis not present

## 2017-07-15 DIAGNOSIS — I10 Essential (primary) hypertension: Secondary | ICD-10-CM | POA: Insufficient documentation

## 2017-07-15 LAB — CBC WITH DIFFERENTIAL/PLATELET
BASOS PCT: 1 %
Basophils Absolute: 0 10*3/uL (ref 0–0.1)
EOS PCT: 6 %
Eosinophils Absolute: 0.3 10*3/uL (ref 0–0.7)
HCT: 34.3 % — ABNORMAL LOW (ref 40.0–52.0)
Hemoglobin: 12.4 g/dL — ABNORMAL LOW (ref 13.0–18.0)
Lymphocytes Relative: 29 %
Lymphs Abs: 1.6 10*3/uL (ref 1.0–3.6)
MCH: 34.4 pg — ABNORMAL HIGH (ref 26.0–34.0)
MCHC: 36.1 g/dL — AB (ref 32.0–36.0)
MCV: 95.2 fL (ref 80.0–100.0)
MONO ABS: 0.3 10*3/uL (ref 0.2–1.0)
Monocytes Relative: 6 %
Neutro Abs: 3.1 10*3/uL (ref 1.4–6.5)
Neutrophils Relative %: 58 %
Platelets: 131 10*3/uL — ABNORMAL LOW (ref 150–440)
RBC: 3.6 MIL/uL — ABNORMAL LOW (ref 4.40–5.90)
RDW: 13.4 % (ref 11.5–14.5)
WBC: 5.4 10*3/uL (ref 3.8–10.6)

## 2017-07-15 LAB — FERRITIN: FERRITIN: 602 ng/mL — AB (ref 24–336)

## 2017-07-16 ENCOUNTER — Inpatient Hospital Stay: Payer: Medicare Other

## 2017-07-16 VITALS — BP 121/77 | HR 61 | Temp 98.1°F | Resp 18

## 2017-07-16 DIAGNOSIS — R7989 Other specified abnormal findings of blood chemistry: Secondary | ICD-10-CM

## 2017-07-22 ENCOUNTER — Inpatient Hospital Stay: Payer: Medicare Other

## 2017-07-22 DIAGNOSIS — R7989 Other specified abnormal findings of blood chemistry: Secondary | ICD-10-CM

## 2017-07-22 LAB — CBC WITH DIFFERENTIAL/PLATELET
BASOS PCT: 1 %
Basophils Absolute: 0 10*3/uL (ref 0–0.1)
EOS ABS: 0.3 10*3/uL (ref 0–0.7)
Eosinophils Relative: 7 %
HCT: 34.3 % — ABNORMAL LOW (ref 40.0–52.0)
HEMOGLOBIN: 12.1 g/dL — AB (ref 13.0–18.0)
Lymphocytes Relative: 29 %
Lymphs Abs: 1.3 10*3/uL (ref 1.0–3.6)
MCH: 33.8 pg (ref 26.0–34.0)
MCHC: 35.2 g/dL (ref 32.0–36.0)
MCV: 96 fL (ref 80.0–100.0)
Monocytes Absolute: 0.3 10*3/uL (ref 0.2–1.0)
Monocytes Relative: 7 %
NEUTROS PCT: 56 %
Neutro Abs: 2.5 10*3/uL (ref 1.4–6.5)
Platelets: 111 10*3/uL — ABNORMAL LOW (ref 150–440)
RBC: 3.58 MIL/uL — AB (ref 4.40–5.90)
RDW: 13.5 % (ref 11.5–14.5)
WBC: 4.5 10*3/uL (ref 3.8–10.6)

## 2017-07-23 ENCOUNTER — Inpatient Hospital Stay: Payer: Medicare Other

## 2017-07-23 VITALS — BP 121/78 | HR 63 | Temp 96.5°F | Resp 18

## 2017-07-23 DIAGNOSIS — R7989 Other specified abnormal findings of blood chemistry: Secondary | ICD-10-CM

## 2017-07-29 ENCOUNTER — Inpatient Hospital Stay: Payer: Medicare Other

## 2017-07-29 DIAGNOSIS — R7989 Other specified abnormal findings of blood chemistry: Secondary | ICD-10-CM

## 2017-07-29 LAB — CBC WITH DIFFERENTIAL/PLATELET
BASOS ABS: 0 10*3/uL (ref 0–0.1)
BASOS PCT: 1 %
EOS PCT: 8 %
Eosinophils Absolute: 0.4 10*3/uL (ref 0–0.7)
HEMATOCRIT: 35.5 % — AB (ref 40.0–52.0)
HEMOGLOBIN: 12.8 g/dL — AB (ref 13.0–18.0)
LYMPHS ABS: 1.3 10*3/uL (ref 1.0–3.6)
LYMPHS PCT: 26 %
MCH: 34 pg (ref 26.0–34.0)
MCHC: 36.1 g/dL — ABNORMAL HIGH (ref 32.0–36.0)
MCV: 94.4 fL (ref 80.0–100.0)
MONO ABS: 0.3 10*3/uL (ref 0.2–1.0)
Monocytes Relative: 7 %
NEUTROS ABS: 2.8 10*3/uL (ref 1.4–6.5)
Neutrophils Relative %: 58 %
Platelets: 117 10*3/uL — ABNORMAL LOW (ref 150–440)
RBC: 3.76 MIL/uL — AB (ref 4.40–5.90)
RDW: 13.4 % (ref 11.5–14.5)
WBC: 4.8 10*3/uL (ref 3.8–10.6)

## 2017-07-29 LAB — COMPREHENSIVE METABOLIC PANEL
ALBUMIN: 4.4 g/dL (ref 3.5–5.0)
ALK PHOS: 103 U/L (ref 38–126)
ALT: 53 U/L (ref 17–63)
AST: 51 U/L — AB (ref 15–41)
Anion gap: 7 (ref 5–15)
BILIRUBIN TOTAL: 0.7 mg/dL (ref 0.3–1.2)
BUN: 16 mg/dL (ref 6–20)
CALCIUM: 9.8 mg/dL (ref 8.9–10.3)
CO2: 24 mmol/L (ref 22–32)
Chloride: 104 mmol/L (ref 101–111)
Creatinine, Ser: 0.79 mg/dL (ref 0.61–1.24)
GFR calc Af Amer: >60 mL/min (ref >60)
GFR calc non Af Amer: >60 mL/min (ref >60)
GLUCOSE: 282 mg/dL — AB (ref 65–99)
Potassium: 4.3 mmol/L (ref 3.5–5.1)
Sodium: 135 mmol/L (ref 135–145)
TOTAL PROTEIN: 7.1 g/dL (ref 6.5–8.1)

## 2017-07-30 ENCOUNTER — Inpatient Hospital Stay: Payer: Medicare Other

## 2017-07-30 VITALS — BP 119/75 | HR 65 | Temp 97.0°F | Resp 18

## 2017-07-30 DIAGNOSIS — R7989 Other specified abnormal findings of blood chemistry: Secondary | ICD-10-CM

## 2017-07-30 NOTE — Progress Notes (Signed)
Per Mignon Pine, NP proceed with phlebotomy today.

## 2017-08-05 ENCOUNTER — Other Ambulatory Visit: Payer: Self-pay

## 2017-08-05 DIAGNOSIS — R7989 Other specified abnormal findings of blood chemistry: Secondary | ICD-10-CM

## 2017-08-06 ENCOUNTER — Inpatient Hospital Stay (HOSPITAL_BASED_OUTPATIENT_CLINIC_OR_DEPARTMENT_OTHER): Payer: Medicare Other | Admitting: Oncology

## 2017-08-06 ENCOUNTER — Inpatient Hospital Stay: Payer: Medicare Other

## 2017-08-06 ENCOUNTER — Telehealth: Payer: Self-pay | Admitting: Oncology

## 2017-08-06 ENCOUNTER — Encounter: Payer: Self-pay | Admitting: Oncology

## 2017-08-06 ENCOUNTER — Telehealth: Payer: Self-pay | Admitting: *Deleted

## 2017-08-06 DIAGNOSIS — R7989 Other specified abnormal findings of blood chemistry: Secondary | ICD-10-CM

## 2017-08-06 LAB — CBC
HCT: 35 % — ABNORMAL LOW (ref 40.0–52.0)
Hemoglobin: 12.5 g/dL — ABNORMAL LOW (ref 13.0–18.0)
MCH: 33.6 pg (ref 26.0–34.0)
MCHC: 35.7 g/dL (ref 32.0–36.0)
MCV: 94.2 fL (ref 80.0–100.0)
PLATELETS: 119 10*3/uL — AB (ref 150–440)
RBC: 3.72 MIL/uL — AB (ref 4.40–5.90)
RDW: 13.2 % (ref 11.5–14.5)
WBC: 4.8 10*3/uL (ref 3.8–10.6)

## 2017-08-06 LAB — COMPREHENSIVE METABOLIC PANEL
ALT: 41 U/L (ref 17–63)
AST: 43 U/L — AB (ref 15–41)
Albumin: 4.5 g/dL (ref 3.5–5.0)
Alkaline Phosphatase: 86 U/L (ref 38–126)
Anion gap: 9 (ref 5–15)
BUN: 23 mg/dL — AB (ref 6–20)
CHLORIDE: 104 mmol/L (ref 101–111)
CO2: 22 mmol/L (ref 22–32)
CREATININE: 0.78 mg/dL (ref 0.61–1.24)
Calcium: 9.6 mg/dL (ref 8.9–10.3)
GFR calc Af Amer: >60 mL/min (ref >60)
Glucose, Bld: 162 mg/dL — ABNORMAL HIGH (ref 65–99)
Potassium: 4.2 mmol/L (ref 3.5–5.1)
Sodium: 135 mmol/L (ref 135–145)
Total Bilirubin: 0.5 mg/dL (ref 0.3–1.2)
Total Protein: 6.7 g/dL (ref 6.5–8.1)

## 2017-08-06 LAB — FERRITIN: FERRITIN: 293 ng/mL (ref 24–336)

## 2017-08-06 NOTE — Telephone Encounter (Signed)
Pt  Called me back and said he would be fine for next tues for lab in am and phlebotomy in pm

## 2017-08-06 NOTE — Telephone Encounter (Signed)
Phlebotomy 8/29 at 3 pm Every Monday lab at 8:15, for 11 times Every Tuesday phlebotomy at 3 pm, for 11 times 12 week lab: Monday 8:15 andSee md in afternoon with phlebotomy at 3 pm, per 08/06/17 los.

## 2017-08-06 NOTE — Progress Notes (Signed)
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 He has been referred to [place] for elevated ferritin. His recent bloodwork from 05/21/2017 was as follows CMP showed elevated blood sugar of 211, elevated AST and ALT of 54 and 87 respectively. He has been seen by [facility]ology back in 2016 for elevated liver function tests as well but has not followed up with them since then. Most recent ferritin I have for him is from January 2017 which was elevated at 1010. In April 2016 his ferritin was 918. Hepatitis C testing in 2017 was negative. Recent TSH on February 2018 was normal. Recent CBC from July 2017 showed white count of 7.6, H&H of 16.6/46.3 with an MCV of 91.3 and a platelet count of 147.  Patient denies any alcohol use. Denies any unintentional weight loss. No family history of any blood disorders such as thalassemia. No history of any blood transfusions. 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 He also has a known hypodense lesion in his liver which was stable as compared to 2003 and compatible with a benign hemangioma. No splenomegaly or adenopathy  Results of bloodwork from 03-2017 breast follows: CBC showed white count of 8.9, H&H of 15.7/44.1 and a platelet count of 168.CMP was significant for elevated  AST and ALT of 1806, elevated blood sugar of 164 total bilirubin was normal.ferritin levels were elevated at 1807. Iron studies showed iron saturation of 34%. 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  HENT: Negative for congestion, ear discharge and nosebleeds.   Eyes: Negative for blurred vision.  Respiratory: Negative for cough, hemoptysis, sputum production, shortness of breath and wheezing.   Cardiovascular: Negative for chest pain, palpitations, orthopnea and claudication.  Gastrointestinal: Negative for abdominal pain, blood in stool, constipation, diarrhea, heartburn, melena, nausea and vomiting.  Genitourinary: Negative for dysuria, flank pain, frequency, hematuria and urgency.  Musculoskeletal: Negative for back pain, joint pain and myalgias.  Skin: Negative for rash.  Neurological: Negative for dizziness, tingling, focal weakness, seizures, weakness and headaches.  Endo/Heme/Allergies: Does not bruise/bleed easily.  Psychiatric/Behavioral: Negative for depression and suicidal ideas. The patient does not have insomnia.       Allergies  Allergen Reactions  . Bee Venom Swelling  . Empagliflozin Other (See Comments)    Frequent urination     Past Medical History:  Diagnosis Date  . Diabetes mellitus without complication (HCC)   . GERD (gastroesophageal reflux disease)   . Hypertension      Past Surgical History:  Procedure Laterality Date  . CHOLECYSTECTOMY    . MOUTH SURGERY  03/2017  .  ROTATOR CUFF REPAIR     bilateral    Social History   Social History  . Marital status: Married    Spouse name: N/A  . Number of children: N/A  . Years of  education: N/A   Occupational History  . Not on file.   Social History Main Topics  . Smoking status: Never Smoker  . Smokeless tobacco: Never Used  . Alcohol use No  . Drug use: No  . Sexual activity: Not on file   Other Topics Concern  . Not on file   Social History Narrative  . No narrative on file    History reviewed. No pertinent family history.   Current Outpatient Prescriptions:  .  atorvastatin (LIPITOR) 20 MG tablet, Take 20 mg by mouth daily., Disp: , Rfl:  .  glipiZIDE (GLUCOTROL XL) 10 MG 24 hr tablet, Take 1 tablet (10 mg total) by mouth daily with breakfast., Disp: 30 tablet, Rfl: 2 .  ibuprofen (ADVIL,MOTRIN) 200 MG tablet, Take 200 mg by mouth every 6 (six) hours as needed., Disp: , Rfl:  .  lisinopril (PRINIVIL,ZESTRIL) 20 MG tablet, Take 20 mg by mouth daily., Disp: , Rfl:  .  ondansetron (ZOFRAN ODT) 4 MG disintegrating tablet, Take 1 tablet (4 mg total) by mouth every 8 (eight) hours as needed for nausea or vomiting., Disp: 20 tablet, Rfl: 0 .  oxyCODONE-acetaminophen (PERCOCET) 7.5-325 MG tablet, Take 1 tablet by mouth every 4 (four) hours as needed for severe pain., Disp: 20 tablet, Rfl: 0 .  tamsulosin (FLOMAX) 0.4 MG CAPS capsule, Take 1 capsule (0.4 mg total) by mouth daily after breakfast., Disp: 30 capsule, Rfl: 0 .  sitaGLIPtin-metformin (JANUMET) 50-500 MG tablet, Take 1 tablet by mouth 2 (two) times daily., Disp: , Rfl:   Physical exam:  Vitals:   08/06/17 0900  BP: 129/70  Pulse: 63  Resp: 16  Temp: (!) 97.4 F (36.3 C)  TempSrc: Tympanic  Weight: 192 lb 9.6 oz (87.4 kg)  Height: 6' (1.829 m)   Physical Exam  Constitutional: He is oriented to person, place, and time and well-developed, well-nourished, and in no distress.  HENT:  Head: Normocephalic and atraumatic.  Eyes: Pupils are equal, round, and reactive to light. EOM are normal.  Neck: Normal range of motion.  Cardiovascular: Normal rate, regular rhythm and normal heart sounds.     Pulmonary/Chest: Effort normal and breath sounds normal.  Abdominal: Soft. Bowel sounds are normal.  Neurological: He is alert and oriented to person, place, and time.  Skin: Skin is warm and dry.     CMP Latest Ref Rng & Units 08/06/2017  Glucose 65 - 99 mg/dL 409(W)  BUN 6 - 20 mg/dL 11(B)  Creatinine 1.47 - 1.24 mg/dL 8.29  Sodium 562 - 130 mmol/L 135  Potassium 3.5 - 5.1 mmol/L 4.2  Chloride 101 - 111 mmol/L 104  CO2 22 - 32 mmol/L 22  Calcium 8.9 - 10.3 mg/dL 9.6  Total Protein 6.5 - 8.1 g/dL 6.7  Total Bilirubin 0.3 - 1.2 mg/dL 0.5  Alkaline Phos 38 - 126 U/L 86  AST 15 - 41 U/L 43(H)  ALT 17 - 63 U/L 41   CBC Latest Ref Rng & Units 08/06/2017  WBC 3.8 - 10.6 K/uL 4.8  Hemoglobin 13.0 - 18.0 g/dL 12.5(L)  Hematocrit 40.0 - 52.0 % 35.0(L)  Platelets 150 - 440 K/uL 119(L)      Assessment and plan- Patient is a 66 y.o. 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 That along with insulin resistance hepatic iron overload is playing a role in his elevated ferritin. He has responded well to weekly phlebotomy. Ferritin down to 293 from 1800 at diagnosis. LFT's have improved too. Continue weekly phlebotomy until ferritin <50. Check ferritin every other week. CbC weekly. I will see him back in 3 months with cbc with diff for possible phlebotomy   Visit Diagnosis 1. Hereditary hemochromatosis (HCC)   2. Elevated ferritin level      Dr. Owens Shark, MD, MPH [facility] Pager- [phone] 08/06/2017 1:16 PM

## 2017-08-06 NOTE — Progress Notes (Signed)
Patient here for follow up no changes since last appointment 

## 2017-08-06 NOTE — Telephone Encounter (Signed)
Called pt and left message.  He needed to leave and we told him that his appt was mon 8:15 labs and tues 3 pm for his phlebtomy just like he wante dit but then when we went in computer there is labor day holiday this Monday so I called and left message for him to come on tues 8:15 for labs and phlebotomy same day  At 3 pm and it is only for this day. I am waiting for him to cal lme back to make sure that is ok.

## 2017-08-07 ENCOUNTER — Inpatient Hospital Stay: Payer: Medicare Other

## 2017-08-13 ENCOUNTER — Inpatient Hospital Stay: Payer: Medicare Other | Attending: Oncology

## 2017-08-13 ENCOUNTER — Inpatient Hospital Stay: Payer: Medicare Other

## 2017-08-13 DIAGNOSIS — I10 Essential (primary) hypertension: Secondary | ICD-10-CM | POA: Diagnosis not present

## 2017-08-13 DIAGNOSIS — R7989 Other specified abnormal findings of blood chemistry: Secondary | ICD-10-CM | POA: Diagnosis not present

## 2017-08-13 DIAGNOSIS — E1121 Type 2 diabetes mellitus with diabetic nephropathy: Secondary | ICD-10-CM | POA: Insufficient documentation

## 2017-08-13 DIAGNOSIS — Z87442 Personal history of urinary calculi: Secondary | ICD-10-CM | POA: Diagnosis not present

## 2017-08-13 DIAGNOSIS — K219 Gastro-esophageal reflux disease without esophagitis: Secondary | ICD-10-CM | POA: Insufficient documentation

## 2017-08-13 DIAGNOSIS — Z7984 Long term (current) use of oral hypoglycemic drugs: Secondary | ICD-10-CM | POA: Insufficient documentation

## 2017-08-13 DIAGNOSIS — E785 Hyperlipidemia, unspecified: Secondary | ICD-10-CM | POA: Insufficient documentation

## 2017-08-13 DIAGNOSIS — Z79899 Other long term (current) drug therapy: Secondary | ICD-10-CM | POA: Diagnosis not present

## 2017-08-13 LAB — CBC WITH DIFFERENTIAL/PLATELET
BASOS ABS: 0.1 10*3/uL (ref 0–0.1)
Basophils Relative: 1 %
EOS ABS: 0.5 10*3/uL (ref 0–0.7)
Eosinophils Relative: 10 %
HCT: 37.8 % — ABNORMAL LOW (ref 40.0–52.0)
HEMOGLOBIN: 13.4 g/dL (ref 13.0–18.0)
LYMPHS ABS: 1.5 10*3/uL (ref 1.0–3.6)
Lymphocytes Relative: 29 %
MCH: 33.3 pg (ref 26.0–34.0)
MCHC: 35.5 g/dL (ref 32.0–36.0)
MCV: 93.9 fL (ref 80.0–100.0)
Monocytes Absolute: 0.4 10*3/uL (ref 0.2–1.0)
Monocytes Relative: 8 %
NEUTROS PCT: 52 %
Neutro Abs: 2.6 10*3/uL (ref 1.4–6.5)
Platelets: 116 10*3/uL — ABNORMAL LOW (ref 150–440)
RBC: 4.02 MIL/uL — ABNORMAL LOW (ref 4.40–5.90)
RDW: 13.3 % (ref 11.5–14.5)
WBC: 5.1 10*3/uL (ref 3.8–10.6)

## 2017-08-19 ENCOUNTER — Inpatient Hospital Stay: Payer: Medicare Other

## 2017-08-19 DIAGNOSIS — R7989 Other specified abnormal findings of blood chemistry: Secondary | ICD-10-CM

## 2017-08-19 LAB — CBC WITH DIFFERENTIAL/PLATELET
BASOS ABS: 0.1 10*3/uL (ref 0–0.1)
Basophils Relative: 1 %
Eosinophils Absolute: 0.4 10*3/uL (ref 0–0.7)
Eosinophils Relative: 8 %
HEMATOCRIT: 36.7 % — AB (ref 40.0–52.0)
Hemoglobin: 13.1 g/dL (ref 13.0–18.0)
LYMPHS PCT: 28 %
Lymphs Abs: 1.5 10*3/uL (ref 1.0–3.6)
MCH: 32.9 pg (ref 26.0–34.0)
MCHC: 35.6 g/dL (ref 32.0–36.0)
MCV: 92.5 fL (ref 80.0–100.0)
MONO ABS: 0.5 10*3/uL (ref 0.2–1.0)
Monocytes Relative: 8 %
NEUTROS ABS: 3.1 10*3/uL (ref 1.4–6.5)
Neutrophils Relative %: 55 %
Platelets: 126 10*3/uL — ABNORMAL LOW (ref 150–440)
RBC: 3.97 MIL/uL — AB (ref 4.40–5.90)
RDW: 13.1 % (ref 11.5–14.5)
WBC: 5.6 10*3/uL (ref 3.8–10.6)

## 2017-08-20 ENCOUNTER — Inpatient Hospital Stay: Payer: Medicare Other

## 2017-08-20 VITALS — BP 124/84 | HR 70 | Resp 20

## 2017-08-20 DIAGNOSIS — R7989 Other specified abnormal findings of blood chemistry: Secondary | ICD-10-CM

## 2017-08-26 ENCOUNTER — Inpatient Hospital Stay: Payer: Medicare Other

## 2017-08-26 DIAGNOSIS — R7989 Other specified abnormal findings of blood chemistry: Secondary | ICD-10-CM

## 2017-08-26 LAB — CBC WITH DIFFERENTIAL/PLATELET
BASOS PCT: 1 %
Basophils Absolute: 0.1 10*3/uL (ref 0–0.1)
EOS ABS: 0.4 10*3/uL (ref 0–0.7)
EOS PCT: 6 %
HCT: 37 % — ABNORMAL LOW (ref 40.0–52.0)
HEMOGLOBIN: 13.2 g/dL (ref 13.0–18.0)
Lymphocytes Relative: 26 %
Lymphs Abs: 1.7 10*3/uL (ref 1.0–3.6)
MCH: 32.7 pg (ref 26.0–34.0)
MCHC: 35.7 g/dL (ref 32.0–36.0)
MCV: 91.6 fL (ref 80.0–100.0)
MONO ABS: 0.4 10*3/uL (ref 0.2–1.0)
Monocytes Relative: 6 %
NEUTROS PCT: 61 %
Neutro Abs: 4 10*3/uL (ref 1.4–6.5)
PLATELETS: 157 10*3/uL (ref 150–440)
RBC: 4.04 MIL/uL — ABNORMAL LOW (ref 4.40–5.90)
RDW: 13.1 % (ref 11.5–14.5)
WBC: 6.5 10*3/uL (ref 3.8–10.6)

## 2017-08-26 LAB — FERRITIN: FERRITIN: 304 ng/mL (ref 24–336)

## 2017-08-27 ENCOUNTER — Inpatient Hospital Stay: Payer: Medicare Other

## 2017-08-27 VITALS — BP 123/78 | HR 72 | Resp 20

## 2017-08-27 DIAGNOSIS — R7989 Other specified abnormal findings of blood chemistry: Secondary | ICD-10-CM

## 2017-08-30 ENCOUNTER — Other Ambulatory Visit: Payer: Self-pay | Admitting: Gastroenterology

## 2017-08-30 DIAGNOSIS — R945 Abnormal results of liver function studies: Secondary | ICD-10-CM

## 2017-08-30 DIAGNOSIS — R7989 Other specified abnormal findings of blood chemistry: Secondary | ICD-10-CM

## 2017-09-02 ENCOUNTER — Inpatient Hospital Stay: Payer: Medicare Other

## 2017-09-02 DIAGNOSIS — R7989 Other specified abnormal findings of blood chemistry: Secondary | ICD-10-CM

## 2017-09-02 LAB — CBC WITH DIFFERENTIAL/PLATELET
BASOS ABS: 0.1 10*3/uL (ref 0–0.1)
Basophils Relative: 1 %
EOS PCT: 5 %
Eosinophils Absolute: 0.3 10*3/uL (ref 0–0.7)
HEMATOCRIT: 35.2 % — AB (ref 40.0–52.0)
Hemoglobin: 12.5 g/dL — ABNORMAL LOW (ref 13.0–18.0)
LYMPHS ABS: 1.5 10*3/uL (ref 1.0–3.6)
LYMPHS PCT: 27 %
MCH: 32.6 pg (ref 26.0–34.0)
MCHC: 35.6 g/dL (ref 32.0–36.0)
MCV: 91.7 fL (ref 80.0–100.0)
MONO ABS: 0.4 10*3/uL (ref 0.2–1.0)
MONOS PCT: 7 %
NEUTROS ABS: 3.4 10*3/uL (ref 1.4–6.5)
Neutrophils Relative %: 60 %
PLATELETS: 142 10*3/uL — AB (ref 150–440)
RBC: 3.84 MIL/uL — ABNORMAL LOW (ref 4.40–5.90)
RDW: 13.7 % (ref 11.5–14.5)
WBC: 5.5 10*3/uL (ref 3.8–10.6)

## 2017-09-03 ENCOUNTER — Other Ambulatory Visit: Payer: Self-pay | Admitting: Oncology

## 2017-09-03 ENCOUNTER — Inpatient Hospital Stay: Payer: Medicare Other

## 2017-09-03 VITALS — BP 111/78 | HR 72 | Temp 97.1°F | Resp 18

## 2017-09-03 DIAGNOSIS — R7989 Other specified abnormal findings of blood chemistry: Secondary | ICD-10-CM

## 2017-09-05 ENCOUNTER — Ambulatory Visit
Admission: RE | Admit: 2017-09-05 | Discharge: 2017-09-05 | Disposition: A | Discharge status: Home / Self Care | Payer: Medicare Other | Source: Ambulatory Visit | Attending: Gastroenterology | Admitting: Gastroenterology

## 2017-09-05 ENCOUNTER — Other Ambulatory Visit: Payer: Self-pay | Admitting: Gastroenterology

## 2017-09-05 DIAGNOSIS — R160 Hepatomegaly, not elsewhere classified: Secondary | ICD-10-CM

## 2017-09-05 DIAGNOSIS — R945 Abnormal results of liver function studies: Secondary | ICD-10-CM | POA: Insufficient documentation

## 2017-09-05 DIAGNOSIS — R7989 Other specified abnormal findings of blood chemistry: Secondary | ICD-10-CM

## 2017-09-09 ENCOUNTER — Inpatient Hospital Stay: Payer: Medicare Other | Attending: Oncology

## 2017-09-09 DIAGNOSIS — R7989 Other specified abnormal findings of blood chemistry: Secondary | ICD-10-CM

## 2017-09-09 LAB — FERRITIN: Ferritin: 126 ng/mL (ref 24–336)

## 2017-09-09 LAB — CBC WITH DIFFERENTIAL/PLATELET
BASOS ABS: 0 10*3/uL (ref 0–0.1)
Basophils Relative: 1 %
Eosinophils Absolute: 0.2 10*3/uL (ref 0–0.7)
Eosinophils Relative: 5 %
HEMATOCRIT: 32.9 % — AB (ref 40.0–52.0)
HEMOGLOBIN: 11.6 g/dL — AB (ref 13.0–18.0)
LYMPHS PCT: 30 %
Lymphs Abs: 1 10*3/uL (ref 1.0–3.6)
MCH: 32.4 pg (ref 26.0–34.0)
MCHC: 35.3 g/dL (ref 32.0–36.0)
MCV: 91.9 fL (ref 80.0–100.0)
MONO ABS: 0.3 10*3/uL (ref 0.2–1.0)
Monocytes Relative: 8 %
NEUTROS ABS: 2 10*3/uL (ref 1.4–6.5)
NEUTROS PCT: 56 %
Platelets: 114 10*3/uL — ABNORMAL LOW (ref 150–440)
RBC: 3.58 MIL/uL — AB (ref 4.40–5.90)
RDW: 13.4 % (ref 11.5–14.5)
WBC: 3.5 10*3/uL — ABNORMAL LOW (ref 3.8–10.6)

## 2017-09-10 ENCOUNTER — Inpatient Hospital Stay: Payer: Medicare Other

## 2017-09-10 VITALS — BP 124/72 | HR 77 | Resp 20

## 2017-09-10 DIAGNOSIS — R7989 Other specified abnormal findings of blood chemistry: Secondary | ICD-10-CM

## 2017-09-12 ENCOUNTER — Ambulatory Visit
Admission: RE | Admit: 2017-09-12 | Discharge: 2017-09-12 | Disposition: A | Discharge status: Home / Self Care | Payer: Medicare Other | Source: Ambulatory Visit | Attending: Gastroenterology | Admitting: Gastroenterology

## 2017-09-12 DIAGNOSIS — R160 Hepatomegaly, not elsewhere classified: Secondary | ICD-10-CM

## 2017-09-12 DIAGNOSIS — K7460 Unspecified cirrhosis of liver: Secondary | ICD-10-CM | POA: Insufficient documentation

## 2017-09-12 MED ORDER — GADOBENATE DIMEGLUMINE 529 MG/ML IV SOLN
18.0000 mL | Freq: Once | INTRAVENOUS | Status: AC | PRN
  Administered 2017-09-12: 18 mL via INTRAVENOUS

## 2017-09-16 ENCOUNTER — Inpatient Hospital Stay: Payer: Medicare Other

## 2017-09-16 DIAGNOSIS — R7989 Other specified abnormal findings of blood chemistry: Secondary | ICD-10-CM

## 2017-09-16 LAB — CBC WITH DIFFERENTIAL/PLATELET
BASOS ABS: 0 10*3/uL (ref 0–0.1)
BASOS PCT: 1 %
EOS PCT: 6 %
Eosinophils Absolute: 0.2 10*3/uL (ref 0–0.7)
HCT: 33.9 % — ABNORMAL LOW (ref 40.0–52.0)
Hemoglobin: 11.7 g/dL — ABNORMAL LOW (ref 13.0–18.0)
Lymphocytes Relative: 30 %
Lymphs Abs: 1.1 10*3/uL (ref 1.0–3.6)
MCH: 31.8 pg (ref 26.0–34.0)
MCHC: 34.4 g/dL (ref 32.0–36.0)
MCV: 92.6 fL (ref 80.0–100.0)
MONO ABS: 0.3 10*3/uL (ref 0.2–1.0)
Monocytes Relative: 8 %
NEUTROS ABS: 2.1 10*3/uL (ref 1.4–6.5)
Neutrophils Relative %: 55 %
PLATELETS: 115 10*3/uL — AB (ref 150–440)
RBC: 3.67 MIL/uL — AB (ref 4.40–5.90)
RDW: 13.6 % (ref 11.5–14.5)
WBC: 3.8 10*3/uL (ref 3.8–10.6)

## 2017-09-17 ENCOUNTER — Inpatient Hospital Stay: Payer: Medicare Other

## 2017-09-17 VITALS — BP 113/71 | HR 67 | Temp 97.7°F | Resp 20

## 2017-09-17 DIAGNOSIS — R7989 Other specified abnormal findings of blood chemistry: Secondary | ICD-10-CM

## 2017-09-23 ENCOUNTER — Inpatient Hospital Stay: Payer: Medicare Other

## 2017-09-23 DIAGNOSIS — R7989 Other specified abnormal findings of blood chemistry: Secondary | ICD-10-CM

## 2017-09-23 LAB — CBC WITH DIFFERENTIAL/PLATELET
BASOS ABS: 0 10*3/uL (ref 0–0.1)
BASOS PCT: 1 %
EOS ABS: 0.3 10*3/uL (ref 0–0.7)
Eosinophils Relative: 6 %
HEMATOCRIT: 33.8 % — AB (ref 40.0–52.0)
Hemoglobin: 11.6 g/dL — ABNORMAL LOW (ref 13.0–18.0)
Lymphocytes Relative: 28 %
Lymphs Abs: 1.2 10*3/uL (ref 1.0–3.6)
MCH: 31.3 pg (ref 26.0–34.0)
MCHC: 34.3 g/dL (ref 32.0–36.0)
MCV: 91.2 fL (ref 80.0–100.0)
MONO ABS: 0.3 10*3/uL (ref 0.2–1.0)
Monocytes Relative: 8 %
NEUTROS ABS: 2.4 10*3/uL (ref 1.4–6.5)
NEUTROS PCT: 57 %
Platelets: 126 10*3/uL — ABNORMAL LOW (ref 150–440)
RBC: 3.7 MIL/uL — ABNORMAL LOW (ref 4.40–5.90)
RDW: 13.6 % (ref 11.5–14.5)
WBC: 4.3 10*3/uL (ref 3.8–10.6)

## 2017-09-23 LAB — FERRITIN: FERRITIN: 59 ng/mL (ref 24–336)

## 2017-09-24 ENCOUNTER — Inpatient Hospital Stay: Payer: Medicare Other

## 2017-09-24 VITALS — BP 120/55 | HR 63 | Temp 96.3°F | Resp 20

## 2017-09-24 DIAGNOSIS — R7989 Other specified abnormal findings of blood chemistry: Secondary | ICD-10-CM

## 2017-09-24 MED ORDER — SODIUM CHLORIDE 0.9 % IV SOLN
Freq: Once | INTRAVENOUS | Status: DC
  Filled 2017-09-24: qty 1000

## 2017-09-30 ENCOUNTER — Other Ambulatory Visit: Payer: Self-pay

## 2017-09-30 ENCOUNTER — Telehealth: Payer: Self-pay

## 2017-09-30 ENCOUNTER — Inpatient Hospital Stay: Payer: Medicare Other

## 2017-09-30 DIAGNOSIS — R7989 Other specified abnormal findings of blood chemistry: Secondary | ICD-10-CM

## 2017-09-30 LAB — CBC WITH DIFFERENTIAL/PLATELET
Basophils Absolute: 0.1 10*3/uL (ref 0–0.1)
Basophils Relative: 1 %
EOS ABS: 0.2 10*3/uL (ref 0–0.7)
EOS PCT: 4 %
HCT: 35.7 % — ABNORMAL LOW (ref 40.0–52.0)
HEMOGLOBIN: 12 g/dL — AB (ref 13.0–18.0)
Lymphocytes Relative: 19 %
Lymphs Abs: 1 10*3/uL (ref 1.0–3.6)
MCH: 30.7 pg (ref 26.0–34.0)
MCHC: 33.6 g/dL (ref 32.0–36.0)
MCV: 91.5 fL (ref 80.0–100.0)
Monocytes Absolute: 0.3 10*3/uL (ref 0.2–1.0)
Monocytes Relative: 6 %
NEUTROS PCT: 70 %
Neutro Abs: 3.6 10*3/uL (ref 1.4–6.5)
Platelets: 141 10*3/uL — ABNORMAL LOW (ref 150–440)
RBC: 3.91 MIL/uL — AB (ref 4.40–5.90)
RDW: 13.8 % (ref 11.5–14.5)
WBC: 5.1 10*3/uL (ref 3.8–10.6)

## 2017-09-30 LAB — FERRITIN: FERRITIN: 34 ng/mL (ref 24–336)

## 2017-09-30 NOTE — Telephone Encounter (Signed)
Patient walked in to [place] today wanting to speak with some one working with Dr. Smith Robertao.  He states he has reached his goal of 50 and wanted to see about coming once a month instead of once a week? He will take whatever recommendation his is given, he is coming in tomorrow for phlebotomy. I advised him I would send a message to Rao's team since you are in [place] and you would call and let patient know.

## 2017-09-30 NOTE — Telephone Encounter (Signed)
Called pt and got his voicemail and today's ferritin was 34 and now he can go to once a month phlebotomy.  I am cancelling all appts for rest of October including the phlebotomy for tom.  I am cancelling  appts in November until 11/19 for labs and then 11/20 to see md and poss. Phlebotomy.  I asked pt to call me if he has questions and told him I am in [place] today and left my number.

## 2017-10-01 ENCOUNTER — Inpatient Hospital Stay: Payer: Medicare Other

## 2017-10-07 ENCOUNTER — Inpatient Hospital Stay: Payer: Medicare Other

## 2017-10-08 ENCOUNTER — Inpatient Hospital Stay: Payer: Medicare Other

## 2017-10-14 ENCOUNTER — Inpatient Hospital Stay: Payer: Medicare Other

## 2017-10-15 ENCOUNTER — Inpatient Hospital Stay: Payer: Medicare Other

## 2017-10-21 ENCOUNTER — Inpatient Hospital Stay: Payer: Medicare Other

## 2017-10-22 ENCOUNTER — Inpatient Hospital Stay: Payer: Medicare Other

## 2017-10-28 ENCOUNTER — Inpatient Hospital Stay: Payer: Medicare Other | Attending: Oncology

## 2017-10-28 DIAGNOSIS — D696 Thrombocytopenia, unspecified: Secondary | ICD-10-CM | POA: Diagnosis not present

## 2017-10-28 DIAGNOSIS — K7460 Unspecified cirrhosis of liver: Secondary | ICD-10-CM | POA: Insufficient documentation

## 2017-10-28 DIAGNOSIS — Z87442 Personal history of urinary calculi: Secondary | ICD-10-CM | POA: Diagnosis not present

## 2017-10-28 DIAGNOSIS — E785 Hyperlipidemia, unspecified: Secondary | ICD-10-CM | POA: Insufficient documentation

## 2017-10-28 DIAGNOSIS — E1121 Type 2 diabetes mellitus with diabetic nephropathy: Secondary | ICD-10-CM | POA: Insufficient documentation

## 2017-10-28 DIAGNOSIS — K219 Gastro-esophageal reflux disease without esophagitis: Secondary | ICD-10-CM | POA: Insufficient documentation

## 2017-10-28 DIAGNOSIS — Z79899 Other long term (current) drug therapy: Secondary | ICD-10-CM | POA: Diagnosis not present

## 2017-10-28 DIAGNOSIS — I10 Essential (primary) hypertension: Secondary | ICD-10-CM | POA: Insufficient documentation

## 2017-10-28 LAB — CBC
HCT: 41.1 % (ref 40.0–52.0)
Hemoglobin: 13.8 g/dL (ref 13.0–18.0)
MCH: 29.3 pg (ref 26.0–34.0)
MCHC: 33.5 g/dL (ref 32.0–36.0)
MCV: 87.5 fL (ref 80.0–100.0)
PLATELETS: 119 10*3/uL — AB (ref 150–440)
RBC: 4.69 MIL/uL (ref 4.40–5.90)
RDW: 14.4 % (ref 11.5–14.5)
WBC: 5.1 10*3/uL (ref 3.8–10.6)

## 2017-10-28 LAB — FERRITIN: Ferritin: 22 ng/mL — ABNORMAL LOW (ref 24–336)

## 2017-10-29 ENCOUNTER — Inpatient Hospital Stay: Payer: Medicare Other | Attending: Oncology

## 2017-10-29 ENCOUNTER — Telehealth: Payer: Self-pay | Admitting: Oncology

## 2017-10-29 ENCOUNTER — Inpatient Hospital Stay (HOSPITAL_BASED_OUTPATIENT_CLINIC_OR_DEPARTMENT_OTHER): Payer: Medicare Other | Admitting: Oncology

## 2017-10-29 ENCOUNTER — Encounter: Payer: Self-pay | Admitting: Oncology

## 2017-10-29 ENCOUNTER — Other Ambulatory Visit: Payer: Self-pay

## 2017-10-29 VITALS — BP 147/66 | HR 73 | Temp 97.6°F | Wt 198.8 lb

## 2017-10-29 DIAGNOSIS — K7460 Unspecified cirrhosis of liver: Secondary | ICD-10-CM

## 2017-10-29 DIAGNOSIS — Z79899 Other long term (current) drug therapy: Secondary | ICD-10-CM

## 2017-10-29 DIAGNOSIS — D696 Thrombocytopenia, unspecified: Secondary | ICD-10-CM | POA: Diagnosis not present

## 2017-10-29 DIAGNOSIS — R7989 Other specified abnormal findings of blood chemistry: Secondary | ICD-10-CM

## 2017-10-29 NOTE — Telephone Encounter (Signed)
Called patient to advise of updated appointments for May 2019. Left msg with appt details and also mailed a copy of updated appointments.

## 2017-10-29 NOTE — Progress Notes (Signed)
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 He has been referred to us for elevated ferritin. His recent bloodwork from 05/21/2017 was as follows CMP showed elevated blood sugar of 211, elevated AST and ALT of 54 and 87 respectively. He has been seen by [facility] back in 2016 for elevated liver function tests as well but has not followed up with them since then. Most recent ferritin I have for him is from January 2017 which was elevated at 1010. In April 2016 his ferritin was 918. Hepatitis C testing in 2017 was negative. Recent TSH on February 2018 was normal. Recent CBC from July 2017 showed white count of 7.6, H&H of 16.6/46.3 with an MCV of 91.3 and a platelet count of 147.  Patient denies any alcohol use. Denies any unintentional weight loss. No family history of any blood disorders such as thalassemia. No history of any blood transfusions. 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 He also has a known hypodense lesion in his liver which was stable as compared to 2003 and compatible with a benign hemangioma. No splenomegaly or adenopathy  Results of bloodwork from 03-2017 breast follows: CBC showed white count of 8.9, H&H of 15.7/44.1 and a platelet count of 168.CMP was significant for  elevated AST and ALT of 1806, elevated blood sugar of 164 total bilirubin was normal.ferritin levels were elevated at 1807. Iron studies showed iron saturation of 34%. 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  HENT: Negative for congestion, ear discharge and nosebleeds.   Eyes: Negative for blurred vision.  Respiratory: Negative for cough, hemoptysis, sputum production, shortness of breath and wheezing.   Cardiovascular: Negative for chest pain, palpitations, orthopnea and claudication.  Gastrointestinal: Negative for abdominal pain, blood in stool, constipation, diarrhea, heartburn, melena, nausea and vomiting.  Genitourinary: Negative for dysuria, flank pain, frequency, hematuria and urgency.  Musculoskeletal: Negative for back pain, joint pain and myalgias.  Skin: Negative for rash.  Neurological: Negative for dizziness, tingling, focal weakness, seizures, weakness and headaches.  Endo/Heme/Allergies: Does not bruise/bleed easily.  Psychiatric/Behavioral: Negative for depression and suicidal ideas. The patient does not have insomnia.       Allergies  Allergen Reactions  . Bee Venom Swelling  . Empagliflozin Other (See Comments)    Frequent urination     Past Medical History:  Diagnosis Date  . Diabetes mellitus without complication (HCC)   . GERD (gastroesophageal reflux disease)   . Hypertension      Past Surgical History:  Procedure Laterality Date  . CHOLECYSTECTOMY    . MOUTH SURGERY  03/2017  .  ROTATOR CUFF REPAIR     bilateral    Social History   Socioeconomic History  . Marital status: Married    Spouse name: Not on file  . Number of children: Not on file  . Years of  education: Not on file  . Highest education level: Not on file  Social Needs  . Financial resource strain: Not on file  . Food insecurity - worry: Not on file  . Food insecurity - inability: Not on file  . Transportation needs - medical: Not on file  . Transportation needs - non-medical: Not on file  Occupational History  . Not on file  Tobacco Use  . Smoking status: Never Smoker  . Smokeless tobacco: Never Used  Substance and Sexual Activity  . Alcohol use: No  . Drug use: No  . Sexual activity: Not on file  Other Topics Concern  . Not on file  Social History Narrative  . Not on file    No family history on file.   Current Outpatient Medications:  .  atorvastatin (LIPITOR) 20 MG tablet, Take 20 mg by mouth daily., Disp: , Rfl:  .  glipiZIDE (GLUCOTROL XL) 10 MG 24 hr tablet, Take 1 tablet (10 mg total) by mouth daily with breakfast., Disp: 30 tablet, Rfl: 2 .  ibuprofen (ADVIL,MOTRIN) 200 MG tablet, Take 200 mg by mouth every 6 (six) hours as needed., Disp: , Rfl:  .  lisinopril (PRINIVIL,ZESTRIL) 20 MG tablet, Take 20 mg by mouth daily., Disp: , Rfl:  .  ondansetron (ZOFRAN ODT) 4 MG disintegrating tablet, Take 1 tablet (4 mg total) by mouth every 8 (eight) hours as needed for nausea or vomiting., Disp: 20 tablet, Rfl: 0 .  oxyCODONE-acetaminophen (PERCOCET) 7.5-325 MG tablet, Take 1 tablet by mouth every 4 (four) hours as needed for severe pain., Disp: 20 tablet, Rfl: 0 .  sitaGLIPtin-metformin (JANUMET) 50-500 MG tablet, Take 1 tablet by mouth 2 (two) times daily., Disp: , Rfl:  .  tamsulosin (FLOMAX) 0.4 MG CAPS capsule, Take 1 capsule (0.4 mg total) by mouth daily after breakfast., Disp: 30 capsule, Rfl: 0  Physical exam:  Vitals:   10/29/17 1422  BP: (!) 147/66  Pulse: 73  Temp: 97.6 F (36.4 C)  TempSrc: Tympanic  Weight: 198 lb 12.8 oz (90.2 kg)   Physical Exam  Constitutional: He is oriented to person, place, and time and well-developed, well-nourished, and in  no distress.  HENT:  Head: Normocephalic and atraumatic.  Eyes: EOM are normal. Pupils are equal, round, and reactive to light.  Neck: Normal range of motion.  Cardiovascular: Normal rate, regular rhythm and normal heart sounds.  Pulmonary/Chest: Effort normal and breath sounds normal.  Abdominal: Soft. Bowel sounds are normal.  Neurological: He is alert and oriented to person, place, and time.  Skin: Skin is warm and dry.     CMP Latest Ref Rng & Units 08/06/2017  Glucose 65 - 99 mg/dL 161(W162(H)  BUN 6 - 20 mg/dL 96(E23(H)  Creatinine 4.540.61 - 1.24 mg/dL 0.980.78  Sodium 119135 - 147145 mmol/L 135  Potassium 3.5 - 5.1 mmol/L 4.2  Chloride 101 - 111 mmol/L 104  CO2 22 - 32 mmol/L 22  Calcium 8.9 - 10.3 mg/dL 9.6  Total Protein 6.5 - 8.1 g/dL 6.7  Total Bilirubin 0.3 - 1.2 mg/dL 0.5  Alkaline Phos 38 - 126 U/L 86  AST 15 - 41 U/L 43(H)  ALT 17 - 63 U/L 41   CBC Latest Ref Rng &  Units 10/28/2017  WBC 3.8 - 10.6 K/uL 5.1  Hemoglobin 13.0 - 18.0 g/dL 16.1  Hematocrit 09.6 - 52.0 % 41.1  Platelets 150 - 440 K/uL 119(L)     Assessment and plan- Patient is a 66 y.o. 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 He does not need phlebotomy at this time. Cbc is normal except for mild chronic thrombocytopenia which has been stable. Repeat cbc, ferritin and cmp in 3 and 6 months and I will see him in 6 months for possible phlebotomy. Goal ferritin is <50.   Patient does have evidence of hepatic cirrhosis based on his mr scan in oct 2018. 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 Elevated ferritin level   2. Iron overload syndrome      Dr. Owens Shark, MD, MPH [facility] Pager- [phone] 10/29/2017 2:57 PM

## 2017-11-21 ENCOUNTER — Encounter: Payer: Self-pay | Admitting: *Deleted

## 2017-11-22 ENCOUNTER — Ambulatory Visit: Payer: Medicare Other | Admitting: Anesthesiology

## 2017-11-22 ENCOUNTER — Encounter
Admission: RE | Disposition: A | Discharge status: Home / Self Care | Payer: Self-pay | Source: Ambulatory Visit | Attending: Gastroenterology

## 2017-11-22 ENCOUNTER — Encounter: Payer: Self-pay | Admitting: *Deleted

## 2017-11-22 ENCOUNTER — Ambulatory Visit
Admission: RE | Admit: 2017-11-22 | Discharge: 2017-11-22 | Disposition: A | Discharge status: Home / Self Care | Payer: Medicare Other | Source: Ambulatory Visit | Attending: Gastroenterology | Admitting: Gastroenterology

## 2017-11-22 DIAGNOSIS — Z1211 Encounter for screening for malignant neoplasm of colon: Secondary | ICD-10-CM | POA: Insufficient documentation

## 2017-11-22 DIAGNOSIS — I10 Essential (primary) hypertension: Secondary | ICD-10-CM | POA: Insufficient documentation

## 2017-11-22 DIAGNOSIS — K7460 Unspecified cirrhosis of liver: Secondary | ICD-10-CM | POA: Insufficient documentation

## 2017-11-22 DIAGNOSIS — K641 Second degree hemorrhoids: Secondary | ICD-10-CM | POA: Insufficient documentation

## 2017-11-22 DIAGNOSIS — Z7984 Long term (current) use of oral hypoglycemic drugs: Secondary | ICD-10-CM | POA: Insufficient documentation

## 2017-11-22 DIAGNOSIS — E119 Type 2 diabetes mellitus without complications: Secondary | ICD-10-CM | POA: Diagnosis not present

## 2017-11-22 DIAGNOSIS — E785 Hyperlipidemia, unspecified: Secondary | ICD-10-CM | POA: Diagnosis not present

## 2017-11-22 DIAGNOSIS — Z86010 Personal history of colonic polyps: Secondary | ICD-10-CM | POA: Diagnosis not present

## 2017-11-22 DIAGNOSIS — Z79899 Other long term (current) drug therapy: Secondary | ICD-10-CM | POA: Insufficient documentation

## 2017-11-22 DIAGNOSIS — K644 Residual hemorrhoidal skin tags: Secondary | ICD-10-CM | POA: Diagnosis not present

## 2017-11-22 DIAGNOSIS — K635 Polyp of colon: Secondary | ICD-10-CM | POA: Insufficient documentation

## 2017-11-22 HISTORY — DX: Hyperlipidemia, unspecified: E78.5

## 2017-11-22 HISTORY — DX: Herpesviral vesicular dermatitis: B00.1

## 2017-11-22 HISTORY — PX: COLONOSCOPY WITH PROPOFOL: SHX5780

## 2017-11-22 HISTORY — DX: Unspecified osteoarthritis, unspecified site: M19.90

## 2017-11-22 HISTORY — DX: Gout, unspecified: M10.9

## 2017-11-22 LAB — CBC WITH DIFFERENTIAL/PLATELET
BASOS ABS: 0.1 10*3/uL (ref 0–0.1)
BASOS PCT: 1 %
EOS ABS: 0.2 10*3/uL (ref 0–0.7)
EOS PCT: 3 %
HEMATOCRIT: 47.1 % (ref 40.0–52.0)
Hemoglobin: 15.9 g/dL (ref 13.0–18.0)
Lymphocytes Relative: 26 %
Lymphs Abs: 1.5 10*3/uL (ref 1.0–3.6)
MCH: 29.2 pg (ref 26.0–34.0)
MCHC: 33.8 g/dL (ref 32.0–36.0)
MCV: 86.4 fL (ref 80.0–100.0)
MONO ABS: 0.4 10*3/uL (ref 0.2–1.0)
MONOS PCT: 7 %
Neutro Abs: 3.6 10*3/uL (ref 1.4–6.5)
Neutrophils Relative %: 63 %
PLATELETS: 130 10*3/uL — AB (ref 150–440)
RBC: 5.45 MIL/uL (ref 4.40–5.90)
RDW: 15.6 % — AB (ref 11.5–14.5)
WBC: 5.8 10*3/uL (ref 3.8–10.6)

## 2017-11-22 LAB — GLUCOSE, CAPILLARY: Glucose-Capillary: 167 mg/dL — ABNORMAL HIGH (ref 65–99)

## 2017-11-22 LAB — PROTIME-INR
INR: 1.04
PROTHROMBIN TIME: 13.5 s (ref 11.4–15.2)

## 2017-11-22 SURGERY — COLONOSCOPY WITH PROPOFOL
Anesthesia: General

## 2017-11-22 MED ORDER — SODIUM CHLORIDE 0.9 % IV SOLN
INTRAVENOUS | Status: DC
  Administered 2017-11-22: 1000 mL via INTRAVENOUS

## 2017-11-22 MED ORDER — MIDAZOLAM HCL 2 MG/2ML IJ SOLN
INTRAMUSCULAR | Status: AC
  Filled 2017-11-22: qty 2

## 2017-11-22 MED ORDER — SODIUM CHLORIDE 0.9 % IV SOLN: INTRAVENOUS | Status: DC

## 2017-11-22 MED ORDER — FENTANYL CITRATE (PF) 100 MCG/2ML IJ SOLN
INTRAMUSCULAR | Status: DC | PRN
  Administered 2017-11-22 (×2): 50 ug via INTRAVENOUS

## 2017-11-22 MED ORDER — LIDOCAINE HCL (PF) 2 % IJ SOLN
INTRAMUSCULAR | Status: DC | PRN
  Administered 2017-11-22: 80 mg

## 2017-11-22 MED ORDER — MIDAZOLAM HCL 5 MG/5ML IJ SOLN
INTRAMUSCULAR | Status: DC | PRN
  Administered 2017-11-22: 2 mg via INTRAVENOUS

## 2017-11-22 MED ORDER — PROPOFOL 500 MG/50ML IV EMUL
INTRAVENOUS | Status: AC
  Filled 2017-11-22: qty 50

## 2017-11-22 MED ORDER — LIDOCAINE HCL (PF) 2 % IJ SOLN
INTRAMUSCULAR | Status: AC
  Filled 2017-11-22: qty 10

## 2017-11-22 MED ORDER — EPHEDRINE SULFATE 50 MG/ML IJ SOLN
INTRAMUSCULAR | Status: DC | PRN
  Administered 2017-11-22: 10 mg via INTRAVENOUS

## 2017-11-22 MED ORDER — FENTANYL CITRATE (PF) 100 MCG/2ML IJ SOLN
INTRAMUSCULAR | Status: AC
  Filled 2017-11-22: qty 2

## 2017-11-22 MED ORDER — PROPOFOL 500 MG/50ML IV EMUL
INTRAVENOUS | Status: DC | PRN
  Administered 2017-11-22: 75 ug/kg/min via INTRAVENOUS

## 2017-11-22 MED ORDER — PROPOFOL 10 MG/ML IV BOLUS
INTRAVENOUS | Status: DC | PRN
  Administered 2017-11-22: 30 mg via INTRAVENOUS
  Administered 2017-11-22: 20 mg via INTRAVENOUS

## 2017-11-22 NOTE — Anesthesia Postprocedure Evaluation (Signed)
Anesthesia Post Note  Patient: Gregory SaverClifford C Quinn Jr.  Procedure(s) Performed: COLONOSCOPY WITH PROPOFOL (N/A )  Patient location during evaluation: Endoscopy Anesthesia Type: General Level of consciousness: awake and alert and oriented Pain management: pain level controlled Vital Signs Assessment: post-procedure vital signs reviewed and stable Respiratory status: spontaneous breathing, nonlabored ventilation and respiratory function stable Cardiovascular status: blood pressure returned to baseline and stable Postop Assessment: no signs of nausea or vomiting Anesthetic complications: no     Last Vitals:  Vitals:   11/22/17 1133 11/22/17 1143  BP: 98/70 111/73  Pulse: 68 70  Resp: 13 19  Temp:    SpO2: 97% 97%    Last Pain:  Vitals:   11/22/17 1123  TempSrc: Tympanic                 Addylin Manke

## 2017-11-22 NOTE — Anesthesia Preprocedure Evaluation (Signed)
Anesthesia Evaluation  Patient identified by MRN, date of birth, ID band Patient awake    Reviewed: Allergy & Precautions, NPO status , Patient's Chart, lab work & pertinent test results  History of Anesthesia Complications Negative for: history of anesthetic complications  Airway Mallampati: II  TM Distance: >3 FB Neck ROM: Full    Dental  (+) Poor Dentition   Pulmonary neg pulmonary ROS, neg sleep apnea, neg COPD,    breath sounds clear to auscultation- rhonchi (-) wheezing      Cardiovascular hypertension, Pt. on medications (-) CAD, (-) Past MI and (-) Cardiac Stents  Rhythm:Regular Rate:Normal - Systolic murmurs and - Diastolic murmurs    Neuro/Psych negative neurological ROS  negative psych ROS   GI/Hepatic GERD  ,  Endo/Other  diabetes, Oral Hypoglycemic Agents  Renal/GU negative Renal ROS     Musculoskeletal  (+) Arthritis ,   Abdominal (+) - obese,   Peds  Hematology negative hematology ROS (+)   Anesthesia Other Findings Past Medical History: No date: Arthritis     Comment:  osteoarthritis No date: Diabetes mellitus without complication (HCC) No date: Fever blister No date: GERD (gastroesophageal reflux disease) No date: Gout No date: Hyperlipidemia No date: Hypertension   Reproductive/Obstetrics                             Anesthesia Physical Anesthesia Plan  ASA: II  Anesthesia Plan: General   Post-op Pain Management:    Induction: Intravenous  PONV Risk Score and Plan: 1 and Propofol infusion  Airway Management Planned: Natural Airway  Additional Equipment:   Intra-op Plan:   Post-operative Plan:   Informed Consent: I have reviewed the patients History and Physical, chart, labs and discussed the procedure including the risks, benefits and alternatives for the proposed anesthesia with the patient or authorized representative who has indicated his/her  understanding and acceptance.   Dental advisory given  Plan Discussed with: CRNA and Anesthesiologist  Anesthesia Plan Comments:         Anesthesia Quick Evaluation

## 2017-11-22 NOTE — H&P (Signed)
Outpatient short stay form Pre-procedure 11/22/2017 10:46 AM Christena DeemMartin U Liadan Guizar MD  Primary Physician: Dr. Einar CrowMarshall Anderson  Reason for visit:  Colonoscopy  History of present illness:  Patient is a 66 year old male presenting today as above. He tolerated his prep well. He takes no aspirin or blood thinning agent. He does have a personal history of cirrhosis of the liver likely secondary to hemochromatosis and platelets today were 130. His white count was 5.8 with a absolute neutrophil count 3.6. His INR was 1.04.    Current Facility-Administered Medications:  .  0.9 %  sodium chloride infusion, , Intravenous, Continuous, Christena DeemSkulskie, Romain Erion U, MD, Last Rate: 20 mL/hr at 11/22/17 1037, 1,000 mL at 11/22/17 1037 .  0.9 %  sodium chloride infusion, , Intravenous, Continuous, Christena DeemSkulskie, Jianni Batten U, MD  Medications Prior to Admission  Medication Sig Dispense Refill Last Dose  . lisinopril (PRINIVIL,ZESTRIL) 20 MG tablet Take 20 mg by mouth daily.   11/22/2017 at Unknown time  . atorvastatin (LIPITOR) 20 MG tablet Take 20 mg by mouth daily.   Taking  . glipiZIDE (GLUCOTROL XL) 10 MG 24 hr tablet Take 1 tablet (10 mg total) by mouth daily with breakfast. 30 tablet 2 Taking  . ibuprofen (ADVIL,MOTRIN) 200 MG tablet Take 200 mg by mouth every 6 (six) hours as needed.   Taking  . sitaGLIPtin-metformin (JANUMET) 50-500 MG tablet Take 1 tablet by mouth 2 (two) times daily.   Taking  . [DISCONTINUED] aspirin EC 81 MG tablet Take by mouth.   Taking  . [DISCONTINUED] ondansetron (ZOFRAN ODT) 4 MG disintegrating tablet Take 1 tablet (4 mg total) by mouth every 8 (eight) hours as needed for nausea or vomiting. 20 tablet 0 Taking  . [DISCONTINUED] oxyCODONE-acetaminophen (PERCOCET) 7.5-325 MG tablet Take 1 tablet by mouth every 4 (four) hours as needed for severe pain. 20 tablet 0 Taking  . [DISCONTINUED] tamsulosin (FLOMAX) 0.4 MG CAPS capsule Take 1 capsule (0.4 mg total) by mouth daily after breakfast. 30  capsule 0 Taking     Allergies  Allergen Reactions  . Bee Venom Swelling  . Empagliflozin Other (See Comments)    Frequent urination     Past Medical History:  Diagnosis Date  . Arthritis    osteoarthritis  . Diabetes mellitus without complication (HCC)   . Fever blister   . GERD (gastroesophageal reflux disease)   . Gout   . Hyperlipidemia   . Hypertension     Review of systems:      Physical Exam    Heart and lungs: Regular rate and rhythm without rub or gallop, lungs are bilaterally clear.    HEENT: Normocephalic atraumatic eyes are anicteric    Other:     Pertinant exam for procedure: Soft nontender nondistended bowel sounds positive normoactive.    Planned proceedures: Colonoscopy and indicated procedures. I have discussed the risks benefits and complications of procedures to include not limited to bleeding, infection, perforation and the risk of sedation and the patient wishes to proceed.    Christena DeemMartin U Lamin Chandley, MD Gastroenterology 11/22/2017  10:46 AM

## 2017-11-22 NOTE — Anesthesia Post-op Follow-up Note (Signed)
Anesthesia QCDR form completed.        

## 2017-11-22 NOTE — Transfer of Care (Signed)
Immediate Anesthesia Transfer of Care Note  Patient: Gregory SaverClifford C Dunsmore Jr.  Procedure(s) Performed: COLONOSCOPY WITH PROPOFOL (N/A )  Patient Location: PACU  Anesthesia Type:General  Level of Consciousness: sedated  Airway & Oxygen Therapy: Patient Spontanous Breathing and Patient connected to nasal cannula oxygen  Post-op Assessment: Report given to RN and Post -op Vital signs reviewed and stable  Post vital signs: Reviewed and stable  Last Vitals:  Vitals:   11/22/17 0951 11/22/17 1123  BP: 137/79 91/66  Pulse: 75 69  Resp: 20 12  Temp: (!) 36.4 C (!) 35.7 C  SpO2: 99% 100%    Last Pain:  Vitals:   11/22/17 1123  TempSrc: Tympanic         Complications: No apparent anesthesia complications

## 2017-11-22 NOTE — Op Note (Signed)
[facility] Gastroenterology Patient Name: Gregory MountsClifford Cruz Procedure Date: 11/22/2017 10:47 AM MRN: [id number] Account #: [id number] Date of Birth: 1951-10-19 Admit Type: Outpatient Age: 66 [facility] Gender: Male Note Status: Finalized Procedure:            Colonoscopy Indications:          Personal history of colonic polyps Providers:            Christena DeemMartin U. Skulskie, MD Referring MD:         Marya AmslerMarshall W. Dareen PianoAnderson MD, MD (Referring MD) Medicines:            Monitored Anesthesia Care Complications:        No immediate complications. Procedure:            Pre-Anesthesia Assessment:                       - ASA Grade Assessment: II - A patient with mild                        systemic disease.                       After obtaining informed consent, the colonoscope was                        passed under direct vision. Throughout the procedure,                        the patient's blood pressure, pulse, and oxygen                        saturations were monitored continuously. The                        Colonoscope was introduced through the anus and                        advanced to the the cecum, identified by appendiceal                        orifice and ileocecal valve. The colonoscopy was                        performed without difficulty. The patient tolerated the                        procedure well. The quality of the bowel preparation                        was fair. Findings:      A 2 mm polyp was found in the distal sigmoid colon. The polyp was       sessile. The polyp was removed with a cold biopsy forceps. Resection and       retrieval were complete.      Non-bleeding external and internal hemorrhoids were found during       retroflexion, during perianal exam and during anoscopy. The hemorrhoids       were small and Grade II (internal hemorrhoids that prolapse but reduce       spontaneously).      The exam was otherwise without  abnormality. Impression:           - Preparation of the colon was fair.                       - One 2 mm polyp in the distal sigmoid colon, removed                        with a cold biopsy forceps. Resected and retrieved.                       - Non-bleeding external and internal hemorrhoids.                       - The examination was otherwise normal. Recommendation:       - Discharge patient to home.                       - Use Analpram HC Cream 2.5%: Apply externally TID for                        10 days.                       - Telephone GI clinic for pathology results in 1 week. Procedure Code(s):    --- Professional ---                       [phone], Colonoscopy, flexible; with biopsy, single or                        multiple Diagnosis Code(s):    --- Professional ---                       K64.1, Second degree hemorrhoids                       D12.5, Benign neoplasm of sigmoid colon                       Z86.010, Personal history of colonic polyps CPT copyright 2016 American Medical Association. All rights reserved. The codes documented in this report are preliminary and upon coder review may  be revised to meet current compliance requirements. Christena DeemMartin U Skulskie, MD 11/22/2017 11:25:47 AM This report has been signed electronically. Number of Addenda: 0 Note Initiated On: 11/22/2017 10:47 AM Scope Withdrawal Time: 0 hours 9 minutes 38 seconds  Total Procedure Duration: 0 hours 19 minutes 51 seconds       [facility]

## 2017-11-25 LAB — SURGICAL PATHOLOGY

## 2017-11-26 ENCOUNTER — Encounter: Payer: Self-pay | Admitting: Gastroenterology

## 2018-01-29 ENCOUNTER — Inpatient Hospital Stay: Payer: Medicare Other | Attending: Oncology

## 2018-01-29 DIAGNOSIS — R7989 Other specified abnormal findings of blood chemistry: Secondary | ICD-10-CM | POA: Insufficient documentation

## 2018-01-29 LAB — COMPREHENSIVE METABOLIC PANEL
ALBUMIN: 4.3 g/dL (ref 3.5–5.0)
ALT: 51 U/L (ref 17–63)
ANION GAP: 7 (ref 5–15)
AST: 44 U/L — ABNORMAL HIGH (ref 15–41)
Alkaline Phosphatase: 107 U/L (ref 38–126)
BILIRUBIN TOTAL: 1.1 mg/dL (ref 0.3–1.2)
BUN: 19 mg/dL (ref 6–20)
CO2: 23 mmol/L (ref 22–32)
Calcium: 9.4 mg/dL (ref 8.9–10.3)
Chloride: 103 mmol/L (ref 101–111)
Creatinine, Ser: 0.77 mg/dL (ref 0.61–1.24)
GFR calc Af Amer: >60 mL/min (ref >60)
GLUCOSE: 312 mg/dL — AB (ref 65–99)
POTASSIUM: 4.3 mmol/L (ref 3.5–5.1)
Sodium: 133 mmol/L — ABNORMAL LOW (ref 135–145)
TOTAL PROTEIN: 7.1 g/dL (ref 6.5–8.1)

## 2018-01-29 LAB — CBC WITH DIFFERENTIAL/PLATELET
BASOS ABS: 0.1 10*3/uL (ref 0–0.1)
BASOS PCT: 1 %
Eosinophils Absolute: 0.5 10*3/uL (ref 0–0.7)
Eosinophils Relative: 10 %
HEMATOCRIT: 43.8 % (ref 40.0–52.0)
HEMOGLOBIN: 15.5 g/dL (ref 13.0–18.0)
LYMPHS PCT: 27 %
Lymphs Abs: 1.3 10*3/uL (ref 1.0–3.6)
MCH: 31 pg (ref 26.0–34.0)
MCHC: 35.3 g/dL (ref 32.0–36.0)
MCV: 87.9 fL (ref 80.0–100.0)
Monocytes Absolute: 0.4 10*3/uL (ref 0.2–1.0)
Monocytes Relative: 8 %
NEUTROS ABS: 2.6 10*3/uL (ref 1.4–6.5)
NEUTROS PCT: 54 %
Platelets: 98 10*3/uL — ABNORMAL LOW (ref 150–440)
RBC: 4.99 MIL/uL (ref 4.40–5.90)
RDW: 17.1 % — ABNORMAL HIGH (ref 11.5–14.5)
WBC: 4.9 10*3/uL (ref 3.8–10.6)

## 2018-01-29 LAB — FERRITIN: FERRITIN: 110 ng/mL (ref 24–336)

## 2018-02-04 ENCOUNTER — Inpatient Hospital Stay: Payer: Medicare Other | Admitting: *Deleted

## 2018-02-04 ENCOUNTER — Inpatient Hospital Stay: Payer: Medicare Other

## 2018-02-04 DIAGNOSIS — R7989 Other specified abnormal findings of blood chemistry: Secondary | ICD-10-CM

## 2018-02-04 LAB — CBC WITH DIFFERENTIAL/PLATELET
BASOS ABS: 0.1 10*3/uL (ref 0–0.1)
BASOS PCT: 1 %
EOS PCT: 4 %
Eosinophils Absolute: 0.3 10*3/uL (ref 0–0.7)
HCT: 44.5 % (ref 40.0–52.0)
Hemoglobin: 15.7 g/dL (ref 13.0–18.0)
LYMPHS PCT: 30 %
Lymphs Abs: 2.1 10*3/uL (ref 1.0–3.6)
MCH: 31.2 pg (ref 26.0–34.0)
MCHC: 35.3 g/dL (ref 32.0–36.0)
MCV: 88.2 fL (ref 80.0–100.0)
Monocytes Absolute: 0.5 10*3/uL (ref 0.2–1.0)
Monocytes Relative: 8 %
Neutro Abs: 4.2 10*3/uL (ref 1.4–6.5)
Neutrophils Relative %: 57 %
PLATELETS: 127 10*3/uL — AB (ref 150–440)
RBC: 5.05 MIL/uL (ref 4.40–5.90)
RDW: 16.5 % — AB (ref 11.5–14.5)
WBC: 7.2 10*3/uL (ref 3.8–10.6)

## 2018-02-10 ENCOUNTER — Other Ambulatory Visit: Payer: Self-pay | Admitting: *Deleted

## 2018-02-10 DIAGNOSIS — R7989 Other specified abnormal findings of blood chemistry: Secondary | ICD-10-CM

## 2018-02-11 ENCOUNTER — Inpatient Hospital Stay: Payer: Medicare Other

## 2018-02-11 ENCOUNTER — Inpatient Hospital Stay: Payer: Medicare Other | Attending: Oncology

## 2018-02-11 ENCOUNTER — Other Ambulatory Visit: Payer: Self-pay | Admitting: *Deleted

## 2018-02-11 VITALS — BP 129/79 | HR 82

## 2018-02-11 DIAGNOSIS — K7460 Unspecified cirrhosis of liver: Secondary | ICD-10-CM | POA: Insufficient documentation

## 2018-02-11 DIAGNOSIS — R7989 Other specified abnormal findings of blood chemistry: Secondary | ICD-10-CM

## 2018-02-11 LAB — HEMOGLOBIN: Hemoglobin: 14 g/dL (ref 13.0–18.0)

## 2018-02-18 ENCOUNTER — Inpatient Hospital Stay: Payer: Medicare Other

## 2018-02-18 DIAGNOSIS — R7989 Other specified abnormal findings of blood chemistry: Secondary | ICD-10-CM

## 2018-02-18 LAB — CBC
HEMATOCRIT: 37 % — AB (ref 40.0–52.0)
HEMOGLOBIN: 13.3 g/dL (ref 13.0–18.0)
MCH: 32.4 pg (ref 26.0–34.0)
MCHC: 35.8 g/dL (ref 32.0–36.0)
MCV: 90.4 fL (ref 80.0–100.0)
Platelets: 128 10*3/uL — ABNORMAL LOW (ref 150–440)
RBC: 4.1 MIL/uL — ABNORMAL LOW (ref 4.40–5.90)
RDW: 15.8 % — ABNORMAL HIGH (ref 11.5–14.5)
WBC: 6.2 10*3/uL (ref 3.8–10.6)

## 2018-02-18 LAB — FERRITIN: Ferritin: 54 ng/mL (ref 24–336)

## 2018-02-19 ENCOUNTER — Telehealth: Payer: Self-pay | Admitting: *Deleted

## 2018-02-19 ENCOUNTER — Other Ambulatory Visit: Payer: Self-pay | Admitting: *Deleted

## 2018-02-19 ENCOUNTER — Encounter: Payer: Self-pay | Admitting: Oncology

## 2018-02-19 NOTE — Telephone Encounter (Signed)
Called pt and he called me back.and agreed on 1 more phlebotomy and then see what ferritin is. He thinks that with the 4 phlebotomies he had brought it down about 60 points so he wants to know if 1 more would do it. He has appt next week and I will make sure hgb and ferritin.

## 2018-02-19 NOTE — Telephone Encounter (Signed)
-----   Message from Creig HinesArchana C Rao, MD sent at 02/18/2018  4:01 PM EDT ----- 2 more phlebotomies and recheck ferritin

## 2018-02-25 ENCOUNTER — Other Ambulatory Visit: Payer: Self-pay | Admitting: *Deleted

## 2018-02-25 ENCOUNTER — Inpatient Hospital Stay: Payer: Medicare Other

## 2018-02-25 VITALS — BP 124/76 | HR 70 | Resp 20

## 2018-02-25 DIAGNOSIS — R7989 Other specified abnormal findings of blood chemistry: Secondary | ICD-10-CM

## 2018-02-25 LAB — HEMOGLOBIN: Hemoglobin: 11.9 g/dL — ABNORMAL LOW (ref 13.0–18.0)

## 2018-02-25 LAB — FERRITIN: Ferritin: 33 ng/mL (ref 24–336)

## 2018-04-28 ENCOUNTER — Other Ambulatory Visit: Payer: Medicare Other

## 2018-04-29 ENCOUNTER — Other Ambulatory Visit: Payer: Medicare Other

## 2018-04-29 ENCOUNTER — Ambulatory Visit: Payer: Medicare Other | Admitting: Oncology

## 2018-05-12 ENCOUNTER — Inpatient Hospital Stay: Payer: Medicare Other | Attending: Oncology

## 2018-05-12 DIAGNOSIS — Z7984 Long term (current) use of oral hypoglycemic drugs: Secondary | ICD-10-CM | POA: Diagnosis not present

## 2018-05-12 DIAGNOSIS — E1121 Type 2 diabetes mellitus with diabetic nephropathy: Secondary | ICD-10-CM | POA: Diagnosis not present

## 2018-05-12 DIAGNOSIS — R7989 Other specified abnormal findings of blood chemistry: Secondary | ICD-10-CM | POA: Diagnosis not present

## 2018-05-12 DIAGNOSIS — I10 Essential (primary) hypertension: Secondary | ICD-10-CM | POA: Insufficient documentation

## 2018-05-12 DIAGNOSIS — Z79899 Other long term (current) drug therapy: Secondary | ICD-10-CM | POA: Diagnosis not present

## 2018-05-12 LAB — COMPREHENSIVE METABOLIC PANEL
ALT: 39 U/L (ref 17–63)
ANION GAP: 9 (ref 5–15)
AST: 40 U/L (ref 15–41)
Albumin: 4.5 g/dL (ref 3.5–5.0)
Alkaline Phosphatase: 85 U/L (ref 38–126)
BILIRUBIN TOTAL: 0.7 mg/dL (ref 0.3–1.2)
BUN: 19 mg/dL (ref 6–20)
CO2: 21 mmol/L — ABNORMAL LOW (ref 22–32)
Calcium: 10.2 mg/dL (ref 8.9–10.3)
Chloride: 104 mmol/L (ref 101–111)
Creatinine, Ser: 0.78 mg/dL (ref 0.61–1.24)
Glucose, Bld: 160 mg/dL — ABNORMAL HIGH (ref 65–99)
POTASSIUM: 4.2 mmol/L (ref 3.5–5.1)
Sodium: 134 mmol/L — ABNORMAL LOW (ref 135–145)
TOTAL PROTEIN: 7.2 g/dL (ref 6.5–8.1)

## 2018-05-12 LAB — CBC WITH DIFFERENTIAL/PLATELET
BASOS ABS: 0 10*3/uL (ref 0–0.1)
BASOS PCT: 1 %
Eosinophils Absolute: 0.3 10*3/uL (ref 0–0.7)
Eosinophils Relative: 5 %
HEMATOCRIT: 41.5 % (ref 40.0–52.0)
Hemoglobin: 14.3 g/dL (ref 13.0–18.0)
Lymphocytes Relative: 29 %
Lymphs Abs: 1.5 10*3/uL (ref 1.0–3.6)
MCH: 30.1 pg (ref 26.0–34.0)
MCHC: 34.4 g/dL (ref 32.0–36.0)
MCV: 87.5 fL (ref 80.0–100.0)
MONO ABS: 0.3 10*3/uL (ref 0.2–1.0)
Monocytes Relative: 6 %
Neutro Abs: 3 10*3/uL (ref 1.4–6.5)
Neutrophils Relative %: 59 %
PLATELETS: 134 10*3/uL — AB (ref 150–440)
RBC: 4.74 MIL/uL (ref 4.40–5.90)
RDW: 14.4 % (ref 11.5–14.5)
WBC: 5.1 10*3/uL (ref 3.8–10.6)

## 2018-05-12 LAB — FERRITIN: FERRITIN: 17 ng/mL — AB (ref 24–336)

## 2018-05-13 ENCOUNTER — Telehealth: Payer: Self-pay | Admitting: *Deleted

## 2018-05-13 ENCOUNTER — Inpatient Hospital Stay: Payer: Medicare Other

## 2018-05-13 ENCOUNTER — Inpatient Hospital Stay: Payer: Medicare Other | Admitting: Oncology

## 2018-05-13 ENCOUNTER — Encounter: Payer: Self-pay | Admitting: Oncology

## 2018-05-13 DIAGNOSIS — R7989 Other specified abnormal findings of blood chemistry: Secondary | ICD-10-CM

## 2018-05-13 DIAGNOSIS — I10 Essential (primary) hypertension: Secondary | ICD-10-CM | POA: Diagnosis not present

## 2018-05-13 DIAGNOSIS — E1121 Type 2 diabetes mellitus with diabetic nephropathy: Secondary | ICD-10-CM | POA: Diagnosis not present

## 2018-05-13 NOTE — Progress Notes (Signed)
No new changes noted today 

## 2018-05-13 NOTE — Telephone Encounter (Signed)
Faxed notes to GI attn: Dr. 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  Will await response.

## 2018-05-13 NOTE — Progress Notes (Signed)
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 He has been referred to [place] for elevated ferritin. His recent bloodwork from 05/21/2017 was as follows CMP showed elevated blood sugar of 211, elevated AST and ALT of 54 and 87 respectively. He has been seen by [facility] gastro[facility]ology back in 2016 for elevated liver function tests as well but has not followed up with them since then. Most recent ferritin I have for him is from January 2017 which was elevated at 1010. In April 2016 his ferritin was 918. Hepatitis C testing in 2017 was negative. Recent TSH on February 2018 was normal. Recent CBC from July 2017 showed white count of 7.6, H&H of 16.6/46.3 with an MCV of 91.3 and a platelet count of 147.  Patient denies any alcohol use. Denies any unintentional weight loss. No family history of any blood disorders such as thalassemia. No history of any blood transfusions. 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 He also has a known hypodense lesion in his liver which was stable as compared to 2003 and compatible with a benign hemangioma. No splenomegaly or adenopathy  Results of bloodwork from 03-2017 breast follows: CBC showed white count of 8.9, H&H of 15.7/44.1 and a platelet count of 168.CMP was significant  for elevated AST and ALT of 1806, elevated blood sugar of 164 total bilirubin was normal.ferritin levels were elevated at 1807. Iron studies showed iron saturation of 34%. 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 Denies any complaints. Appetite is good. Denies any abdominal pain. He has lost 6 pounds over last 1 year unintentionally  ECOG PS- 0 Pain scale- 0  Review of systems- Review of Systems  Constitutional: Negative for chills, fever, malaise/fatigue and weight loss.  HENT: Negative for congestion, ear discharge and nosebleeds.   Eyes: Negative for blurred vision.  Respiratory: Negative for cough, hemoptysis, sputum production, shortness of breath and wheezing.   Cardiovascular: Negative for chest pain, palpitations, orthopnea and claudication.  Gastrointestinal: Negative for abdominal pain, blood in stool, constipation, diarrhea, heartburn, melena, nausea and vomiting.  Genitourinary: Negative for dysuria, flank pain, frequency, hematuria and urgency.  Musculoskeletal: Negative for back pain, joint pain and myalgias.  Skin: Negative for rash.  Neurological: Negative for dizziness, tingling, focal weakness, seizures, weakness and headaches.  Endo/Heme/Allergies: Does not bruise/bleed easily.  Psychiatric/Behavioral: Negative for depression and suicidal ideas. The patient does not have insomnia.       Allergies  Allergen Reactions  . Bee Venom Swelling  . Empagliflozin Other (See Comments)    Frequent urination     Past Medical History:  Diagnosis Date  . Arthritis    osteoarthritis  . Diabetes mellitus without complication (HCC)   . Fever blister   . GERD (  gastroesophageal reflux disease)   . Gout   . Hyperlipidemia   . Hypertension      Past Surgical History:  Procedure Laterality Date  . CHOLECYSTECTOMY    . COLONOSCOPY    .  COLONOSCOPY WITH PROPOFOL N/A 11/22/2017   Procedure: COLONOSCOPY WITH PROPOFOL;  Surgeon: Christena Deem, MD;  Location: [facility] ENDOSCOPY;  Service: Endoscopy;  Laterality: N/A;  . MOUTH SURGERY  03/2017  . ROTATOR CUFF REPAIR     bilateral    Social History   Socioeconomic History  . Marital status: Married    Spouse name: Not on file  . Number of children: Not on file  . Years of education: Not on file  . Highest education level: Not on file  Occupational History  . Not on file  Social Needs  . Financial resource strain: Not on file  . Food insecurity:    Worry: Not on file    Inability: Not on file  . Transportation needs:    Medical: Not on file    Non-medical: Not on file  Tobacco Use  . Smoking status: Never Smoker  . Smokeless tobacco: Never Used  Substance and Sexual Activity  . Alcohol use: No  . Drug use: No  . Sexual activity: Not on file  Lifestyle  . Physical activity:    Days per week: Not on file    Minutes per session: Not on file  . Stress: Not on file  Relationships  . Social connections:    Talks on phone: Not on file    Gets together: Not on file    Attends religious service: Not on file    Active member of club or organization: Not on file    Attends meetings of clubs or organizations: Not on file    Relationship status: Not on file  . Intimate partner violence:    Fear of current or ex partner: Not on file    Emotionally abused: Not on file    Physically abused: Not on file    Forced sexual activity: Not on file  Other Topics Concern  . Not on file  Social History Narrative  . Not on file    History reviewed. No pertinent family history.   Current Outpatient Medications:  .  atorvastatin (LIPITOR) 20 MG tablet, Take 20 mg by mouth daily., Disp: , Rfl:  .  Dulaglutide (TRULICITY) 1.5 MG/0.5ML SOPN, Inject 1.5 mg into the skin once a week., Disp: , Rfl:  .  glipiZIDE (GLUCOTROL XL) 10 MG 24 hr tablet, Take 1 tablet (10 mg total) by  mouth daily with breakfast., Disp: 30 tablet, Rfl: 2 .  lisinopril (PRINIVIL,ZESTRIL) 20 MG tablet, Take 20 mg by mouth daily., Disp: , Rfl:  .  ibuprofen (ADVIL,MOTRIN) 200 MG tablet, Take 200 mg by mouth every 6 (six) hours as needed., Disp: , Rfl:  .  sitaGLIPtin-metformin (JANUMET) 50-500 MG tablet, Take 1 tablet by mouth 2 (two) times daily., Disp: , Rfl:   Physical exam:  Vitals:   05/13/18 1348  BP: 118/81  Pulse: 85  Resp: 18  Temp: 97.6 F (36.4 C)  TempSrc: Tympanic  SpO2: 96%  Weight: 186 lb 4.8 oz (84.5 kg)  Height: 6' (1.829 m)   Physical Exam  Constitutional: He is oriented to person, place, and time.  Thin adult man in no acute distress  HENT:  Head: Normocephalic and atraumatic.  Eyes: Pupils are equal, round, and reactive to light. EOM are normal.  Neck:  Normal range of motion.  Cardiovascular: Normal rate, regular rhythm and normal heart sounds.  Pulmonary/Chest: Effort normal and breath sounds normal.  Abdominal: Soft. Bowel sounds are normal.  Neurological: He is alert and oriented to person, place, and time.  Skin: Skin is warm and dry.     CMP Latest Ref Rng & Units 05/12/2018  Glucose 65 - 99 mg/dL 161(W160(H)  BUN 6 - 20 mg/dL 19  Creatinine 9.600.61 - 4.541.24 mg/dL 0.980.78  Sodium 119135 - 147145 mmol/L 134(L)  Potassium 3.5 - 5.1 mmol/L 4.2  Chloride 101 - 111 mmol/L 104  CO2 22 - 32 mmol/L 21(L)  Calcium 8.9 - 10.3 mg/dL 82.910.2  Total Protein 6.5 - 8.1 g/dL 7.2  Total Bilirubin 0.3 - 1.2 mg/dL 0.7  Alkaline Phos 38 - 126 U/L 85  AST 15 - 41 U/L 40  ALT 17 - 63 U/L 39   CBC Latest Ref Rng & Units 05/12/2018  WBC 3.8 - 10.6 K/uL 5.1  Hemoglobin 13.0 - 18.0 g/dL 56.214.3  Hematocrit 13.040.0 - 52.0 % 41.5  Platelets 150 - 440 K/uL 134(L)      Assessment and plan- Patient is a 67 y.o. 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 He is not  anemic. Platelet counts have been mildly low but that is chronic. He does not need phlebotomy at this point. Goal ferritin is <50. Repeat cbc and ferritin in 3 and 6 months and I will see him in 6 months. Will check cmp in 6 months as well  Patient had evidence of cirrhosis based on Mri in oct 2018. He is heterozygous for C 282Y. He therefore needs HCC surveillance. He does not have f/u with [facility] and we will [facility] base with [facility]     Visit Diagnosis 1. Iron overload syndrome   2. Elevated ferritin level      Dr. Owens SharkArchana Raymont Andreoni, MD, MPH [facility] [phone] 05/13/2018 3:23 PM

## 2018-05-15 ENCOUNTER — Other Ambulatory Visit: Payer: Self-pay | Admitting: Gastroenterology

## 2018-05-15 ENCOUNTER — Telehealth: Payer: Self-pay | Admitting: *Deleted

## 2018-05-15 DIAGNOSIS — K7460 Unspecified cirrhosis of liver: Secondary | ICD-10-CM

## 2018-05-15 NOTE — Telephone Encounter (Signed)
rcvd fax back from GI that they have made pt an appt to see their office 6/7 at 8:30 and they have called pt and he is aware of appt.

## 2018-05-22 ENCOUNTER — Ambulatory Visit
Admission: RE | Admit: 2018-05-22 | Discharge: 2018-05-22 | Disposition: A | Discharge status: Home / Self Care | Payer: Medicare Other | Source: Ambulatory Visit | Attending: Gastroenterology | Admitting: Gastroenterology

## 2018-05-22 DIAGNOSIS — Z9049 Acquired absence of other specified parts of digestive tract: Secondary | ICD-10-CM | POA: Insufficient documentation

## 2018-05-22 DIAGNOSIS — K7460 Unspecified cirrhosis of liver: Secondary | ICD-10-CM | POA: Diagnosis not present

## 2018-07-21 ENCOUNTER — Encounter: Payer: Self-pay | Admitting: Anesthesiology

## 2018-07-21 ENCOUNTER — Ambulatory Visit
Admission: RE | Admit: 2018-07-21 | Discharge: 2018-07-21 | Disposition: A | Discharge status: Home / Self Care | Payer: Medicare Other | Source: Ambulatory Visit | Attending: Gastroenterology | Admitting: Gastroenterology

## 2018-07-21 ENCOUNTER — Ambulatory Visit: Payer: Medicare Other | Admitting: Anesthesiology

## 2018-07-21 ENCOUNTER — Encounter
Admission: RE | Disposition: A | Discharge status: Home / Self Care | Payer: Self-pay | Source: Ambulatory Visit | Attending: Gastroenterology

## 2018-07-21 ENCOUNTER — Other Ambulatory Visit: Payer: Self-pay | Admitting: Gastroenterology

## 2018-07-21 DIAGNOSIS — E1151 Type 2 diabetes mellitus with diabetic peripheral angiopathy without gangrene: Secondary | ICD-10-CM | POA: Diagnosis not present

## 2018-07-21 DIAGNOSIS — Z791 Long term (current) use of non-steroidal anti-inflammatories (NSAID): Secondary | ICD-10-CM | POA: Diagnosis not present

## 2018-07-21 DIAGNOSIS — Z538 Procedure and treatment not carried out for other reasons: Secondary | ICD-10-CM | POA: Diagnosis not present

## 2018-07-21 DIAGNOSIS — R1310 Dysphagia, unspecified: Secondary | ICD-10-CM | POA: Diagnosis not present

## 2018-07-21 DIAGNOSIS — Z7984 Long term (current) use of oral hypoglycemic drugs: Secondary | ICD-10-CM | POA: Insufficient documentation

## 2018-07-21 DIAGNOSIS — K219 Gastro-esophageal reflux disease without esophagitis: Secondary | ICD-10-CM | POA: Insufficient documentation

## 2018-07-21 DIAGNOSIS — K7460 Unspecified cirrhosis of liver: Secondary | ICD-10-CM | POA: Insufficient documentation

## 2018-07-21 DIAGNOSIS — I10 Essential (primary) hypertension: Secondary | ICD-10-CM | POA: Diagnosis not present

## 2018-07-21 DIAGNOSIS — Z79899 Other long term (current) drug therapy: Secondary | ICD-10-CM | POA: Diagnosis not present

## 2018-07-21 DIAGNOSIS — E785 Hyperlipidemia, unspecified: Secondary | ICD-10-CM | POA: Diagnosis not present

## 2018-07-21 HISTORY — DX: Unspecified cirrhosis of liver: K74.60

## 2018-07-21 LAB — DIFFERENTIAL
BASOS PCT: 1 %
Basophils Absolute: 0.1 10*3/uL (ref 0–0.1)
EOS ABS: 0.3 10*3/uL (ref 0–0.7)
Eosinophils Relative: 6 %
LYMPHS ABS: 1.3 10*3/uL (ref 1.0–3.6)
Lymphocytes Relative: 22 %
MONO ABS: 0.5 10*3/uL (ref 0.2–1.0)
MONOS PCT: 8 %
Neutro Abs: 3.9 10*3/uL (ref 1.4–6.5)
Neutrophils Relative %: 63 %

## 2018-07-21 LAB — CBC
HEMATOCRIT: 45 % (ref 40.0–52.0)
Hemoglobin: 15.5 g/dL (ref 13.0–18.0)
MCH: 30.8 pg (ref 26.0–34.0)
MCHC: 34.5 g/dL (ref 32.0–36.0)
MCV: 89.2 fL (ref 80.0–100.0)
Platelets: 129 10*3/uL — ABNORMAL LOW (ref 150–440)
RBC: 5.05 MIL/uL (ref 4.40–5.90)
RDW: 16.8 % — ABNORMAL HIGH (ref 11.5–14.5)
WBC: 6.1 10*3/uL (ref 3.8–10.6)

## 2018-07-21 LAB — PROTIME-INR
INR: 0.96
Prothrombin Time: 12.7 seconds (ref 11.4–15.2)

## 2018-07-21 SURGERY — ESOPHAGOGASTRODUODENOSCOPY (EGD) WITH PROPOFOL
Anesthesia: General

## 2018-07-21 MED ORDER — PROPOFOL 500 MG/50ML IV EMUL
INTRAVENOUS | Status: AC
  Filled 2018-07-21: qty 50

## 2018-07-21 MED ORDER — SODIUM CHLORIDE 0.9 % IV SOLN
INTRAVENOUS | Status: DC
  Administered 2018-07-21: 1000 mL via INTRAVENOUS

## 2018-07-21 MED ORDER — MIDAZOLAM HCL 2 MG/2ML IJ SOLN
INTRAMUSCULAR | Status: AC
  Filled 2018-07-21: qty 2

## 2018-07-21 MED ORDER — FENTANYL CITRATE (PF) 100 MCG/2ML IJ SOLN
INTRAMUSCULAR | Status: AC
Start: 2018-07-21 — End: ?
  Filled 2018-07-21: qty 2

## 2018-07-21 NOTE — Anesthesia Preprocedure Evaluation (Signed)
Anesthesia Evaluation  Patient identified by MRN, date of birth, ID band Patient awake    Reviewed: Allergy & Precautions, H&P , NPO status , Patient's Chart, lab work & pertinent test results  Airway Mallampati: III  TM Distance: >3 FB Neck ROM: limited    Dental  (+) Chipped, Poor Dentition, Missing   Pulmonary neg pulmonary ROS, neg shortness of breath,           Cardiovascular Exercise Tolerance: Good hypertension, (-) angina+ Peripheral Vascular Disease  (-) Past MI and (-) DOE      Neuro/Psych negative neurological ROS  negative psych ROS   GI/Hepatic Neg liver ROS, GERD  Medicated and Controlled,  Endo/Other  diabetes, Type 2  Renal/GU Renal disease  negative genitourinary   Musculoskeletal   Abdominal   Peds  Hematology negative hematology ROS (+)   Anesthesia Other Findings Past Medical History: No date: Arthritis     Comment:  osteoarthritis No date: Diabetes mellitus without complication (HCC)     Comment:  type 2 No date: Fever blister No date: GERD (gastroesophageal reflux disease) No date: Gout No date: Hyperlipidemia No date: Hypertension No date: Liver cirrhosis (HCC)  Past Surgical History: No date: CHOLECYSTECTOMY No date: COLONOSCOPY 11/22/2017: COLONOSCOPY WITH PROPOFOL; N/A     Comment:  Procedure: COLONOSCOPY WITH PROPOFOL;  Surgeon:               Christena DeemSkulskie, Martin U, MD;  Location: [facility] ENDOSCOPY;                Service: Endoscopy;  Laterality: N/A; 03/2017: MOUTH SURGERY No date: ROTATOR CUFF REPAIR     Comment:  bilateral  BMI    Body Mass Index:  25.77 kg/m      Reproductive/Obstetrics negative OB ROS                             Anesthesia Physical Anesthesia Plan  ASA: III  Anesthesia Plan: General   Post-op Pain Management:    Induction: Intravenous  PONV Risk Score and Plan: Propofol infusion and TIVA  Airway Management Planned:  Natural Airway and Nasal Cannula  Additional Equipment:   Intra-op Plan:   Post-operative Plan:   Informed Consent: I have reviewed the patients History and Physical, chart, labs and discussed the procedure including the risks, benefits and alternatives for the proposed anesthesia with the patient or authorized representative who has indicated his/her understanding and acceptance.   Dental Advisory Given  Plan Discussed with: Anesthesiologist, CRNA and Surgeon  Anesthesia Plan Comments: (Patient consented for risks of anesthesia including but not limited to:  - adverse reactions to medications - risk of intubation if required - damage to teeth, lips or other oral mucosa - sore throat or hoarseness - Damage to heart, brain, lungs or loss of life  Patient voiced understanding.)        Anesthesia Quick Evaluation

## 2018-07-21 NOTE — Anesthesia Post-op Follow-up Note (Signed)
Anesthesia QCDR form completed.        

## 2018-07-21 NOTE — H&P (Signed)
Patient is a 67 year old male presenting today for EGD.  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  However in discussion with the patient stated he has been having issues with dysphagia, cervical in nature and occasionally will need to regurgitate foods.  This was not discussed prior to set up for this procedure.  At that time he had denied any other GI symptoms.  He is been having symptoms of needing to regurgitate something about once maybe twice a month.  This is actually been happening over the.  Past several years.  Intermittent in nature.  I discussed with him that I would prefer to have a barium swallow prior to doing the upper scope to help with arranging the appropriate sedated scope procedure.  He is not currently on a PPI.  To the above I will hold on doing this procedure today.  We will arrange for barium swallow as soon as we can get this done and rearranges scope following results.

## 2018-07-21 NOTE — Addendum Note (Signed)
Addendum  created 07/21/18 1039 by Piscitello, Cleda MccreedyJoseph K, MD   Sign clinical note

## 2018-07-25 ENCOUNTER — Ambulatory Visit
Admission: RE | Admit: 2018-07-25 | Discharge: 2018-07-25 | Disposition: A | Discharge status: Home / Self Care | Payer: Medicare Other | Source: Ambulatory Visit | Attending: Gastroenterology | Admitting: Gastroenterology

## 2018-07-25 DIAGNOSIS — R1310 Dysphagia, unspecified: Secondary | ICD-10-CM

## 2018-07-30 ENCOUNTER — Encounter: Payer: Self-pay | Admitting: *Deleted

## 2018-07-31 ENCOUNTER — Encounter
Admission: RE | Disposition: A | Discharge status: Home / Self Care | Payer: Self-pay | Source: Ambulatory Visit | Attending: Gastroenterology

## 2018-07-31 ENCOUNTER — Ambulatory Visit: Payer: Medicare Other | Admitting: Anesthesiology

## 2018-07-31 ENCOUNTER — Ambulatory Visit
Admission: RE | Admit: 2018-07-31 | Discharge: 2018-07-31 | Disposition: A | Discharge status: Home / Self Care | Payer: Medicare Other | Source: Ambulatory Visit | Attending: Gastroenterology | Admitting: Gastroenterology

## 2018-07-31 DIAGNOSIS — I2510 Atherosclerotic heart disease of native coronary artery without angina pectoris: Secondary | ICD-10-CM | POA: Insufficient documentation

## 2018-07-31 DIAGNOSIS — K2950 Unspecified chronic gastritis without bleeding: Secondary | ICD-10-CM | POA: Insufficient documentation

## 2018-07-31 DIAGNOSIS — E1151 Type 2 diabetes mellitus with diabetic peripheral angiopathy without gangrene: Secondary | ICD-10-CM | POA: Diagnosis not present

## 2018-07-31 DIAGNOSIS — K200 Eosinophilic esophagitis: Secondary | ICD-10-CM | POA: Insufficient documentation

## 2018-07-31 DIAGNOSIS — Z7984 Long term (current) use of oral hypoglycemic drugs: Secondary | ICD-10-CM | POA: Insufficient documentation

## 2018-07-31 DIAGNOSIS — K7460 Unspecified cirrhosis of liver: Secondary | ICD-10-CM | POA: Diagnosis not present

## 2018-07-31 DIAGNOSIS — I10 Essential (primary) hypertension: Secondary | ICD-10-CM | POA: Insufficient documentation

## 2018-07-31 DIAGNOSIS — R1310 Dysphagia, unspecified: Secondary | ICD-10-CM | POA: Diagnosis present

## 2018-07-31 DIAGNOSIS — Z7982 Long term (current) use of aspirin: Secondary | ICD-10-CM | POA: Diagnosis not present

## 2018-07-31 DIAGNOSIS — E785 Hyperlipidemia, unspecified: Secondary | ICD-10-CM | POA: Diagnosis not present

## 2018-07-31 DIAGNOSIS — M1990 Unspecified osteoarthritis, unspecified site: Secondary | ICD-10-CM | POA: Insufficient documentation

## 2018-07-31 DIAGNOSIS — K219 Gastro-esophageal reflux disease without esophagitis: Secondary | ICD-10-CM | POA: Insufficient documentation

## 2018-07-31 DIAGNOSIS — M109 Gout, unspecified: Secondary | ICD-10-CM | POA: Diagnosis not present

## 2018-07-31 HISTORY — PX: ESOPHAGOGASTRODUODENOSCOPY: SHX5428

## 2018-07-31 HISTORY — DX: Abnormal results of liver function studies: R94.5

## 2018-07-31 HISTORY — DX: Other specified abnormal findings of blood chemistry: R79.89

## 2018-07-31 HISTORY — DX: Hemochromatosis, unspecified: E83.119

## 2018-07-31 HISTORY — DX: Peripheral vascular disease, unspecified: I73.9

## 2018-07-31 HISTORY — DX: Atherosclerotic heart disease of native coronary artery without angina pectoris: I25.10

## 2018-07-31 LAB — GLUCOSE, CAPILLARY: Glucose-Capillary: 162 mg/dL — ABNORMAL HIGH (ref 70–99)

## 2018-07-31 SURGERY — EGD (ESOPHAGOGASTRODUODENOSCOPY)
Anesthesia: General

## 2018-07-31 MED ORDER — PROPOFOL 500 MG/50ML IV EMUL
INTRAVENOUS | Status: DC | PRN
  Administered 2018-07-31: 150 ug/kg/min via INTRAVENOUS

## 2018-07-31 MED ORDER — FENTANYL CITRATE (PF) 100 MCG/2ML IJ SOLN
INTRAMUSCULAR | Status: AC
  Filled 2018-07-31: qty 2

## 2018-07-31 MED ORDER — LIDOCAINE 2% (20 MG/ML) 5 ML SYRINGE
INTRAMUSCULAR | Status: DC | PRN
  Administered 2018-07-31: 30 mg via INTRAVENOUS

## 2018-07-31 MED ORDER — FENTANYL CITRATE (PF) 100 MCG/2ML IJ SOLN
INTRAMUSCULAR | Status: DC | PRN
  Administered 2018-07-31 (×2): 50 ug via INTRAVENOUS

## 2018-07-31 MED ORDER — PROPOFOL 500 MG/50ML IV EMUL
INTRAVENOUS | Status: AC
  Filled 2018-07-31: qty 50

## 2018-07-31 MED ORDER — SODIUM CHLORIDE 0.9 % IV SOLN
INTRAVENOUS | Status: DC
  Administered 2018-07-31: 09:00:00 via INTRAVENOUS
  Administered 2018-07-31: 1000 mL via INTRAVENOUS

## 2018-07-31 MED ORDER — PROPOFOL 10 MG/ML IV BOLUS
INTRAVENOUS | Status: DC | PRN
  Administered 2018-07-31: 100 mg via INTRAVENOUS

## 2018-07-31 NOTE — Op Note (Signed)
[facility] Gastroenterology Patient Name: Gregory Cruz Procedure Date: 07/31/2018 9:26 AM MRN: [id number] Account #: [id number] Date of Birth: 09/12/1951 Admit Type: Outpatient Age: 67 Room: [facility] ENDO ROOM 1 Gender: Male Note Status: Finalized Procedure:            Upper GI endoscopy Indications:          Dysphagia, Cirrhosis rule out esophageal varices Providers:            Christena Deem, MD Referring MD:         Marya Amsler. Dareen Piano MD, MD (Referring MD) Medicines:            Monitored Anesthesia Care Complications:        No immediate complications. Procedure:            Pre-Anesthesia Assessment:                       - ASA Grade Assessment: III - A patient with severe                        systemic disease.                       After obtaining informed consent, the endoscope was                        passed under direct vision. Throughout the procedure,                        the patient's blood pressure, pulse, and oxygen                        saturations were monitored continuously. The Endoscope                        was introduced through the mouth, and advanced to the                        third part of duodenum. The patient tolerated the                        procedure well. Findings:      LA Grade B (one or more mucosal breaks greater than 5 mm, not extending       between the tops of two mucosal folds) esophagitis with no bleeding was       found. Biopsies were taken with a cold forceps for histology.      Mucosal changes including feline appearance and longitudinal furrows       were found in the upper third of the esophagus, in the middle third of       the esophagus and in the lower third of the esophagus. Esophageal       findings were graded using the Eosinophilic Esophagitis Endoscopic       Reference Score (EoE-EREFS) as: Edema Grade 1 Present (decreased clarity       or absence of vascular markings), Rings Grade 1 Mild (subtle        circumferential ridges seen on esophageal distension), Exudates Grade 0       None (no white lesions seen), Furrows Grade 1 Present (vertical lines       with or without visible depth) and  Stricture none (no stricture found).       Biopsies were obtained from the proximal and distal esophagus (GEJ and       26 cm from the incisors) with cold forceps for histology of suspected       eosinophilic esophagitis.      Diffuse mild inflammation characterized by congestion (edema) and       granularity was found in the gastric antrum. Biopsies were taken with a       cold forceps for histology. Biopsies were taken with a cold forceps for       Helicobacter pylori testing.      The examined duodenum was normal.      There is no evidence of esopohagteal or gastric varices. Impression:           - LA Grade B erosive esophagitis. Biopsied.                       - Esophageal mucosal changes suggestive of eosinophilic                        esophagitis. Biopsied.                       - Gastritis. Biopsied.                       - Normal examined duodenum. Recommendation:       - Await pathology results.                       - Use Protonix (pantoprazole) 40 mg PO BID for 3 weeks.                       - Use Protonix (pantoprazole) 40 mg PO daily daily.                       - Return to GI clinic in 4 weeks.                       - Repeat upper endoscopy in 2 months to evaluate the                        response to therapy. Procedure Code(s):    --- Professional ---                       [id number], Esophagogastroduodenoscopy, flexible, transoral;                        with biopsy, single or multiple Diagnosis Code(s):    --- Professional ---                       K20.8, Other esophagitis                       K22.8, Other specified diseases of esophagus                       K29.70, Gastritis, unspecified, without bleeding                       R13.10, Dysphagia, unspecified  K74.60, Unspecified cirrhosis of liver CPT copyright 2017 American Medical Association. All rights reserved. The codes documented in this report are preliminary and upon coder review may  be revised to meet current compliance requirements. Christena DeemMartin U Skulskie, MD 07/31/2018 9:59:46 AM This report has been signed electronically. Number of Addenda: 0 Note Initiated On: 07/31/2018 9:26 AM      [facility]

## 2018-07-31 NOTE — Anesthesia Preprocedure Evaluation (Signed)
Anesthesia Evaluation  Patient identified by MRN, date of birth, ID band Patient awake    Reviewed: Allergy & Precautions, H&P , NPO status , Patient's Chart, lab work & pertinent test results, reviewed documented beta blocker date and time   Airway Mallampati: II   Neck ROM: full    Dental  (+) Poor Dentition   Pulmonary neg pulmonary ROS,    Pulmonary exam normal        Cardiovascular Exercise Tolerance: Good hypertension, On Medications + CAD and + Peripheral Vascular Disease  negative cardio ROS Normal cardiovascular exam Rhythm:regular Rate:Normal     Neuro/Psych negative neurological ROS  negative psych ROS   GI/Hepatic negative GI ROS, Neg liver ROS, GERD  ,  Endo/Other  negative endocrine ROSdiabetes  Renal/GU Renal diseasenegative Renal ROS  negative genitourinary   Musculoskeletal   Abdominal   Peds  Hematology negative hematology ROS (+)   Anesthesia Other Findings Past Medical History: No date: Abnormal LFTs No date: Arthritis     Comment:  osteoarthritis No date: Coronary artery disease No date: Diabetes mellitus without complication (HCC)     Comment:  type 2 No date: Fever blister No date: GERD (gastroesophageal reflux disease) No date: Gout No date: Hemochromatosis No date: Hyperlipidemia No date: Hypertension No date: Liver cirrhosis (HCC) No date: Peripheral vascular disease (HCC) Past Surgical History: No date: CHOLECYSTECTOMY No date: COLONOSCOPY 11/22/2017: COLONOSCOPY WITH PROPOFOL; N/A     Comment:  Procedure: COLONOSCOPY WITH PROPOFOL;  Surgeon:               Christena DeemSkulskie, Martin U, MD;  Location: [facility] ENDOSCOPY;                Service: Endoscopy;  Laterality: N/A; 03/2017: MOUTH SURGERY No date: ROTATOR CUFF REPAIR     Comment:  bilateral BMI    Body Mass Index:  25.77 kg/m     Reproductive/Obstetrics negative OB ROS                              Anesthesia Physical Anesthesia Plan  ASA: III  Anesthesia Plan: General   Post-op Pain Management:    Induction:   PONV Risk Score and Plan:   Airway Management Planned:   Additional Equipment:   Intra-op Plan:   Post-operative Plan:   Informed Consent: I have reviewed the patients History and Physical, chart, labs and discussed the procedure including the risks, benefits and alternatives for the proposed anesthesia with the patient or authorized representative who has indicated his/her understanding and acceptance.   Dental Advisory Given  Plan Discussed with: CRNA  Anesthesia Plan Comments:         Anesthesia Quick Evaluation

## 2018-07-31 NOTE — H&P (Signed)
Outpatient short stay form Pre-procedure 07/31/2018 9:29 AM Christena DeemMartin U Ashanti Ratti MD  Primary Physician: Dr. Einar CrowMarshall Anderson  Reason for visit: EGD  History of present illness: Patient is a 67 year old male presenting today as above.  He has personal history of hemochromatosis with cirrhosis of the liver and evaluation today is for esophageal variceal screening.  Has complained of some dysphagia.  Had a barium swallow done on 07/25/2018.  The standard barium tablet passed normally there was some mild narrowing of the distal esophagus above the GE junction there was no overt evidence of obstructing lesion or reflux.  The cervical esophagus was normal in appearance.  Patient takes no aspirin or blood thinning agent.    Current Facility-Administered Medications:  .  0.9 %  sodium chloride infusion, , Intravenous, Continuous, Christena DeemSkulskie, Nathania Waldman U, MD, Last Rate: 20 mL/hr at 07/31/18 [id number]  Medications Prior to Admission  Medication Sig Dispense Refill Last Dose  . aspirin EC 81 MG tablet Take 81 mg by mouth daily.   Past Week at Unknown time  . atorvastatin (LIPITOR) 20 MG tablet Take 20 mg by mouth daily.   07/30/2018 at Unknown time  . Dulaglutide (TRULICITY) 1.5 MG/0.5ML SOPN Inject 1.5 mg into the skin once a week.   Past Week at Unknown time  . glipiZIDE (GLUCOTROL XL) 10 MG 24 hr tablet Take 1 tablet (10 mg total) by mouth daily with breakfast. 30 tablet 2 07/30/2018 at Unknown time  . ibuprofen (ADVIL,MOTRIN) 200 MG tablet Take 200 mg by mouth every 6 (six) hours as needed.   Past Week at Unknown time  . lisinopril (PRINIVIL,ZESTRIL) 20 MG tablet Take 20 mg by mouth daily.   07/30/2018 at Unknown time  . metFORMIN (GLUMETZA) 500 MG (MOD) 24 hr tablet Take 2,000 mg by mouth daily with lunch.   07/30/2018 at Unknown time  . vitamin B-12 (CYANOCOBALAMIN) 1000 MCG tablet Take 1,000 mcg by mouth daily.   Not Taking at Unknown time     Allergies  Allergen Reactions  . Bee Venom Swelling  .  Empagliflozin Other (See Comments)    Frequent urination     Past Medical History:  Diagnosis Date  . Abnormal LFTs   . Arthritis    osteoarthritis  . Coronary artery disease   . Diabetes mellitus without complication (HCC)    type 2  . Fever blister   . GERD (gastroesophageal reflux disease)   . Gout   . Hemochromatosis   . Hyperlipidemia   . Hypertension   . Liver cirrhosis (HCC)   . Peripheral vascular disease (HCC)     Review of systems:      Physical Exam    Heart and lungs: Regular rate and rhythm without rub or gallop, lungs are bilaterally clear.    HEENT: Normocephalic atraumatic eyes are anicteric    Other:    Pertinant exam for procedure: Soft nontender nondistended bowel sounds positive normoactive.    Planned proceedures: EGD and indicated procedures. I have discussed the risks benefits and complications of procedures to include not limited to bleeding, infection, perforation and the risk of sedation and the patient wishes to proceed.    Christena DeemMartin U Eugen Jeansonne, MD Gastroenterology 07/31/2018  9:29 AM

## 2018-07-31 NOTE — Anesthesia Postprocedure Evaluation (Signed)
Anesthesia Post Note  Patient: Lajean SaverClifford C Wasilewski Jr.  Procedure(s) Performed: ESOPHAGOGASTRODUODENOSCOPY (EGD) (N/A )  Patient location during evaluation: PACU Anesthesia Type: General Level of consciousness: awake and alert Pain management: pain level controlled Vital Signs Assessment: post-procedure vital signs reviewed and stable Respiratory status: spontaneous breathing, nonlabored ventilation, respiratory function stable and patient connected to nasal cannula oxygen Cardiovascular status: blood pressure returned to baseline and stable Postop Assessment: no apparent nausea or vomiting Anesthetic complications: no     Last Vitals:  Vitals:   07/31/18 0957 07/31/18 0959  BP: 100/70 100/70  Pulse: 62 62  Resp: 16 (!) 7  Temp: (!) 35.9 C (!) 35.9 C  SpO2: 100% 100%    Last Pain:  Vitals:   07/31/18 0957  TempSrc: Tympanic  PainSc: Asleep                 Yevette EdwardsJames G Jaleiyah Alas

## 2018-07-31 NOTE — Transfer of Care (Signed)
Immediate Anesthesia Transfer of Care Note  Patient: Gregory SaverClifford C Croke Jr.  Procedure(s) Performed: ESOPHAGOGASTRODUODENOSCOPY (EGD) (N/A )  Patient Location: PACU and Endoscopy Unit  Anesthesia Type:General  Level of Consciousness: sedated  Airway & Oxygen Therapy: Patient Spontanous Breathing and Patient connected to nasal cannula oxygen  Post-op Assessment: Report given to RN and Post -op Vital signs reviewed and stable  Post vital signs: Reviewed and stable  Last Vitals:  Vitals Value Taken Time  BP    Temp 35.9 C 07/31/2018  9:57 AM  Pulse 62 07/31/2018  9:58 AM  Resp 7 07/31/2018  9:58 AM  SpO2 100 % 07/31/2018  9:58 AM  Vitals shown include unvalidated device data.  Last Pain:  Vitals:   07/31/18 0957  TempSrc: Tympanic  PainSc:          Complications: No apparent anesthesia complications

## 2018-07-31 NOTE — Anesthesia Post-op Follow-up Note (Signed)
Anesthesia QCDR form completed.        

## 2018-08-01 ENCOUNTER — Encounter: Payer: Self-pay | Admitting: Gastroenterology

## 2018-08-02 LAB — SURGICAL PATHOLOGY

## 2018-08-13 ENCOUNTER — Inpatient Hospital Stay: Payer: Medicare Other | Attending: Oncology

## 2018-08-13 DIAGNOSIS — R7989 Other specified abnormal findings of blood chemistry: Secondary | ICD-10-CM

## 2018-08-13 LAB — CBC WITH DIFFERENTIAL/PLATELET
BASOS ABS: 0.1 10*3/uL (ref 0–0.1)
BASOS PCT: 1 %
EOS ABS: 0.2 10*3/uL (ref 0–0.7)
EOS PCT: 4 %
HCT: 42.1 % (ref 40.0–52.0)
Hemoglobin: 14.8 g/dL (ref 13.0–18.0)
Lymphocytes Relative: 30 %
Lymphs Abs: 1.9 10*3/uL (ref 1.0–3.6)
MCH: 31.6 pg (ref 26.0–34.0)
MCHC: 35.1 g/dL (ref 32.0–36.0)
MCV: 89.9 fL (ref 80.0–100.0)
Monocytes Absolute: 0.5 10*3/uL (ref 0.2–1.0)
Monocytes Relative: 7 %
Neutro Abs: 3.6 10*3/uL (ref 1.4–6.5)
Neutrophils Relative %: 58 %
PLATELETS: 147 10*3/uL — AB (ref 150–440)
RBC: 4.68 MIL/uL (ref 4.40–5.90)
RDW: 15.4 % — ABNORMAL HIGH (ref 11.5–14.5)
WBC: 6.3 10*3/uL (ref 3.8–10.6)

## 2018-08-13 LAB — FERRITIN: FERRITIN: 67 ng/mL (ref 24–336)

## 2018-08-15 ENCOUNTER — Telehealth: Payer: Self-pay | Admitting: *Deleted

## 2018-08-15 NOTE — Telephone Encounter (Signed)
-----   Message from Creig Hines, MD sent at 08/14/2018  8:15 AM EDT ----- He will need weekly phlebotomy until ferritin <50. Hb, ferritin each week. Thanks, Gregory Cruz

## 2018-08-15 NOTE — Telephone Encounter (Signed)
Called pt to ask if he was contacted about his appts and told about his labs. He said that robin called about his appts but he wants mondays and would prefer mornings but was told that it is impossible.  He would come afternoon. I told  Him it was too late in day to see about Monday afternoon , but I could check on Monday when I get here and call him. He is agreeable to this.

## 2018-08-18 NOTE — Telephone Encounter (Signed)
I called pt back after checking with chemotherapy folks today and they do not have room today to add him on for phlebotomy.  He can come tom. And I will askRobin to change his future appts to Monday. He is agreeable to this.

## 2018-08-19 ENCOUNTER — Inpatient Hospital Stay: Payer: Medicare Other

## 2018-08-19 DIAGNOSIS — R7989 Other specified abnormal findings of blood chemistry: Secondary | ICD-10-CM

## 2018-08-19 LAB — HEMOGLOBIN: HEMOGLOBIN: 13.5 g/dL (ref 13.0–18.0)

## 2018-08-25 ENCOUNTER — Inpatient Hospital Stay: Payer: Medicare Other

## 2018-08-25 DIAGNOSIS — R7989 Other specified abnormal findings of blood chemistry: Secondary | ICD-10-CM

## 2018-08-25 LAB — CBC WITH DIFFERENTIAL/PLATELET
Basophils Absolute: 0.1 10*3/uL (ref 0–0.1)
Basophils Relative: 1 %
EOS ABS: 0.1 10*3/uL (ref 0–0.7)
EOS PCT: 1 %
HCT: 39.8 % — ABNORMAL LOW (ref 40.0–52.0)
Hemoglobin: 14 g/dL (ref 13.0–18.0)
LYMPHS PCT: 17 %
Lymphs Abs: 1.6 10*3/uL (ref 1.0–3.6)
MCH: 32 pg (ref 26.0–34.0)
MCHC: 35.2 g/dL (ref 32.0–36.0)
MCV: 91.1 fL (ref 80.0–100.0)
MONO ABS: 0.5 10*3/uL (ref 0.2–1.0)
Monocytes Relative: 5 %
Neutro Abs: 7.1 10*3/uL — ABNORMAL HIGH (ref 1.4–6.5)
Neutrophils Relative %: 76 %
PLATELETS: 167 10*3/uL (ref 150–440)
RBC: 4.37 MIL/uL — ABNORMAL LOW (ref 4.40–5.90)
RDW: 14.8 % — AB (ref 11.5–14.5)
WBC: 9.4 10*3/uL (ref 3.8–10.6)

## 2018-08-25 LAB — FERRITIN: FERRITIN: 42 ng/mL (ref 24–336)

## 2018-08-25 NOTE — Progress Notes (Signed)
Patient wanted to know what was taking so long, I explained that the parameters in the doctor's note stated goal ferritin less than 50, ferritin results not back yet, and that I messaged Dr. Smith Robertao to clarify, he did not want to wait for results/MD response and said to cancel his appointment today.  MD notified.

## 2018-08-26 ENCOUNTER — Inpatient Hospital Stay: Payer: Medicare Other

## 2018-09-01 ENCOUNTER — Inpatient Hospital Stay: Payer: Medicare Other

## 2018-09-01 DIAGNOSIS — R7989 Other specified abnormal findings of blood chemistry: Secondary | ICD-10-CM

## 2018-09-01 LAB — CBC WITH DIFFERENTIAL/PLATELET
BASOS PCT: 1 %
Basophils Absolute: 0 10*3/uL (ref 0–0.1)
EOS ABS: 0.1 10*3/uL (ref 0–0.7)
Eosinophils Relative: 3 %
HEMATOCRIT: 38.7 % — AB (ref 40.0–52.0)
HEMOGLOBIN: 13.6 g/dL (ref 13.0–18.0)
Lymphocytes Relative: 27 %
Lymphs Abs: 1.5 10*3/uL (ref 1.0–3.6)
MCH: 32.3 pg (ref 26.0–34.0)
MCHC: 35.2 g/dL (ref 32.0–36.0)
MCV: 91.7 fL (ref 80.0–100.0)
MONOS PCT: 7 %
Monocytes Absolute: 0.4 10*3/uL (ref 0.2–1.0)
NEUTROS ABS: 3.4 10*3/uL (ref 1.4–6.5)
Neutrophils Relative %: 62 %
Platelets: 139 10*3/uL — ABNORMAL LOW (ref 150–440)
RBC: 4.22 MIL/uL — ABNORMAL LOW (ref 4.40–5.90)
RDW: 14.3 % (ref 11.5–14.5)
WBC: 5.5 10*3/uL (ref 3.8–10.6)

## 2018-09-01 LAB — FERRITIN: FERRITIN: 29 ng/mL (ref 24–336)

## 2018-09-01 NOTE — Progress Notes (Unsigned)
Patient is here for labs and phlebotomy.  Phlebotomy order is based on ferritin parameters which are processed in the main lab at the hospital.  Patient is upset that he has to wait on lab results to come back and insist on having a phlebotomy before we get results back "because you know that I need it."  Informed patient that his results from last week were below the ordered parameters so we can not proceed without lab results.  Patient stated "I am not waiting, I am leaving."  Message sent to Dr. Smith Robertao.

## 2018-09-02 ENCOUNTER — Inpatient Hospital Stay: Payer: Medicare Other

## 2018-09-02 ENCOUNTER — Telehealth: Payer: Self-pay | Admitting: *Deleted

## 2018-09-02 NOTE — Telephone Encounter (Signed)
Called pt back and told him his ferritin went down and he does not need phlebotomy. He will come dec 2 and then based on ferritin level when he sees doctor then she will decide if she can increase the ferritin level to 100 . I told him that I know he does not like to get phlebotomy and he said it is ok to do but he does not like to wait. I agree with him. We will see what it is like when he has next draw. He is agreeable to above and I cancelled his visit 9/30

## 2018-09-08 ENCOUNTER — Other Ambulatory Visit: Payer: Medicare Other

## 2018-09-09 ENCOUNTER — Inpatient Hospital Stay: Payer: Medicare Other

## 2018-10-02 IMAGING — US US ABDOMEN COMPLETE
1 series · 14 of 25 positions shown · non-contrast
Comparison: 09/05/2017

CLINICAL DATA: Cirrhosis

EXAM:
ABDOMEN ULTRASOUND COMPLETE

[Series 1: us abdomen complete · 14 of 79 slices shown]
[im 1/79]
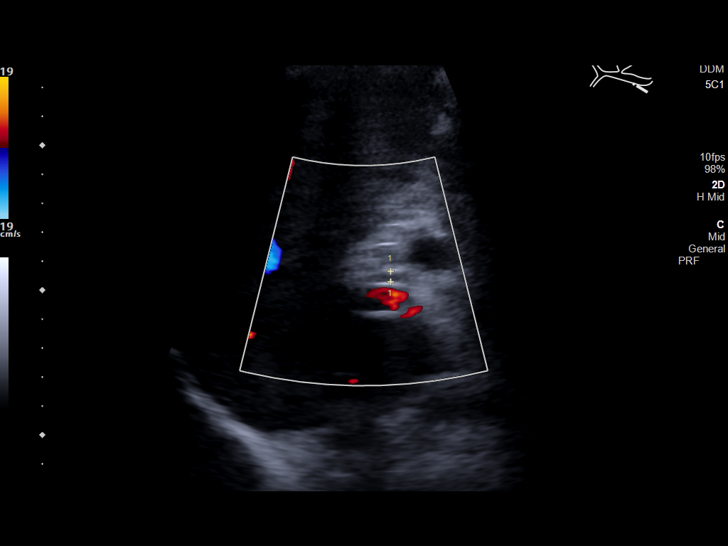
[im 7/79]
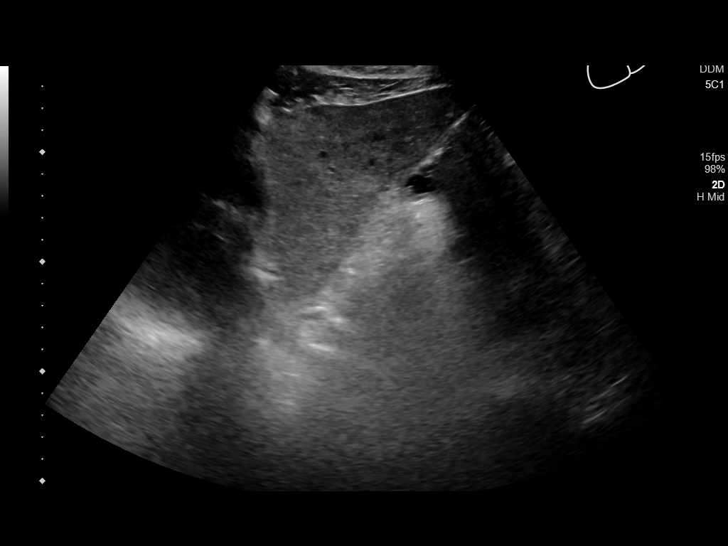
[im 14/79]
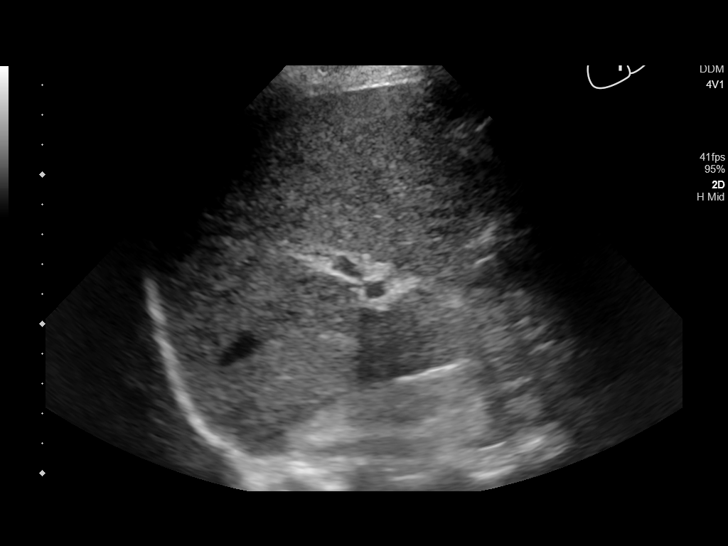
[im 20/79]
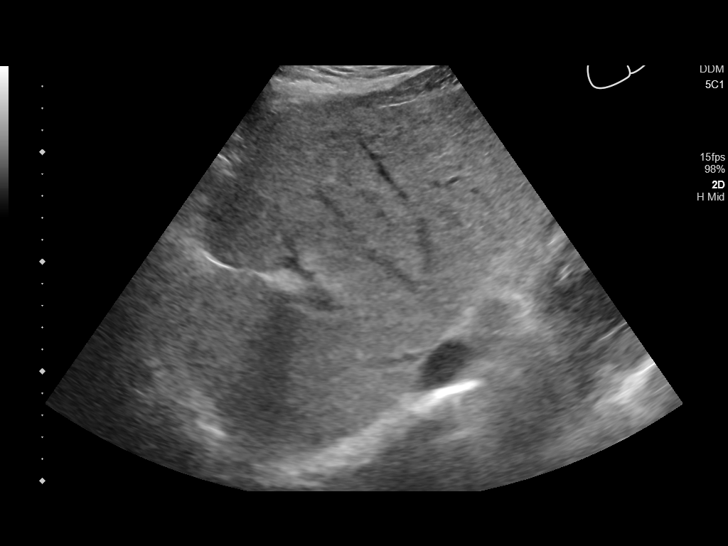
[im 27/79]
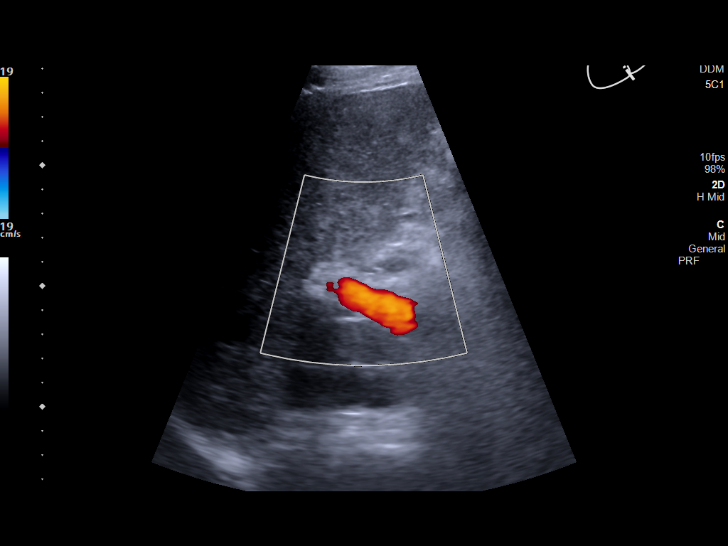
[im 30/79]
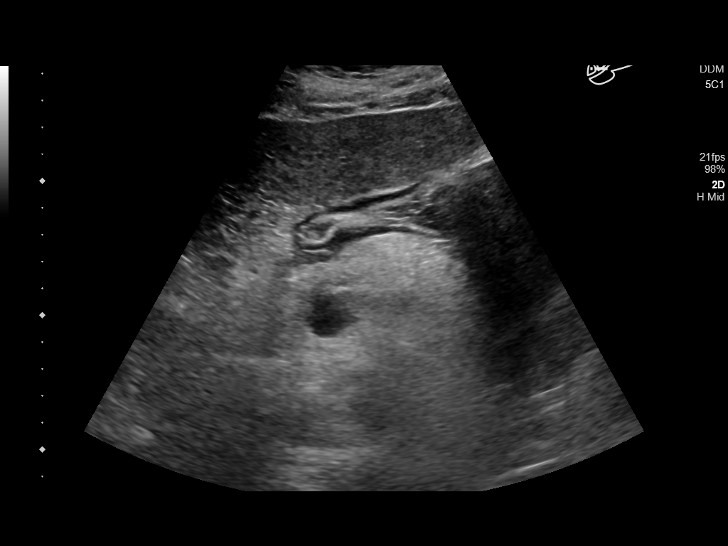
[im 36/79]
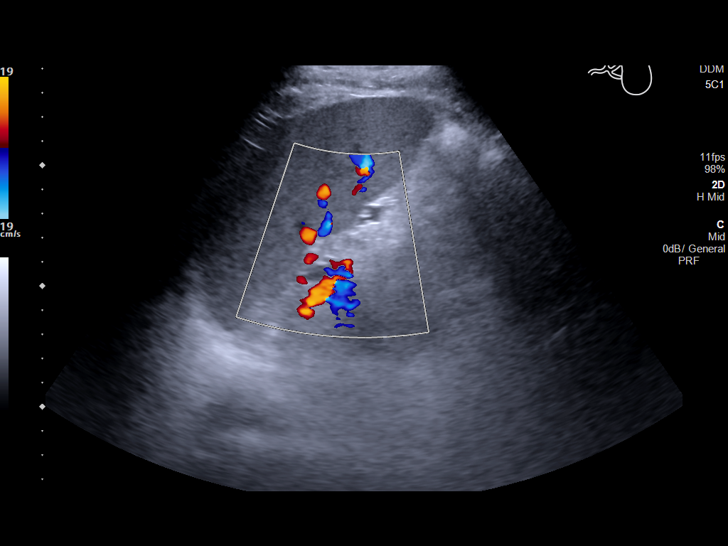
[im 43/79]
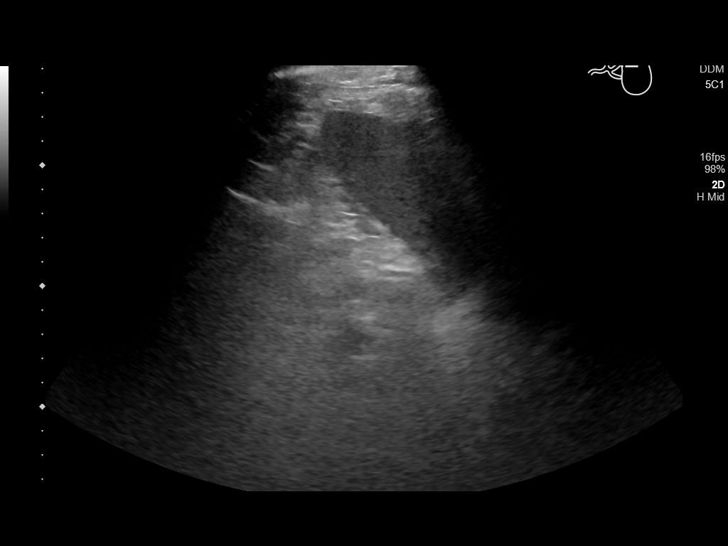
[im 49/79]
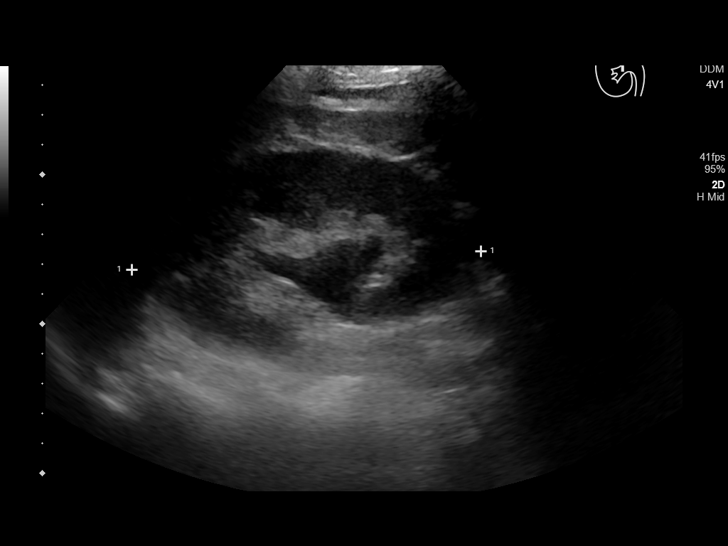
[im 53/79]
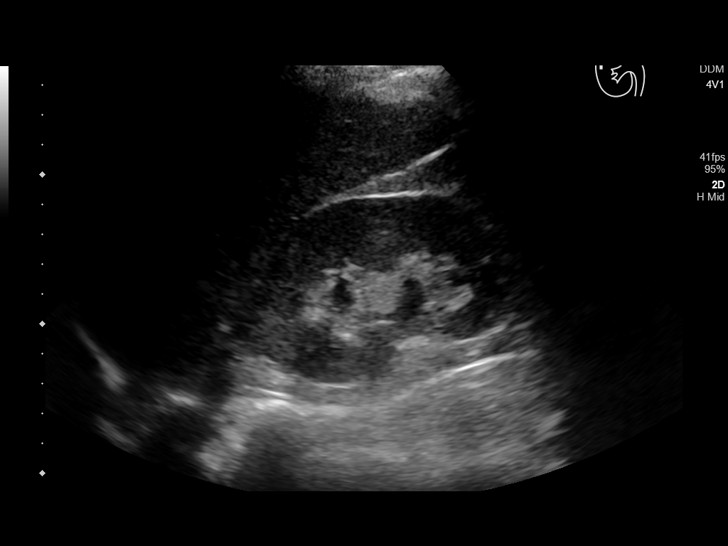
[im 59/79]
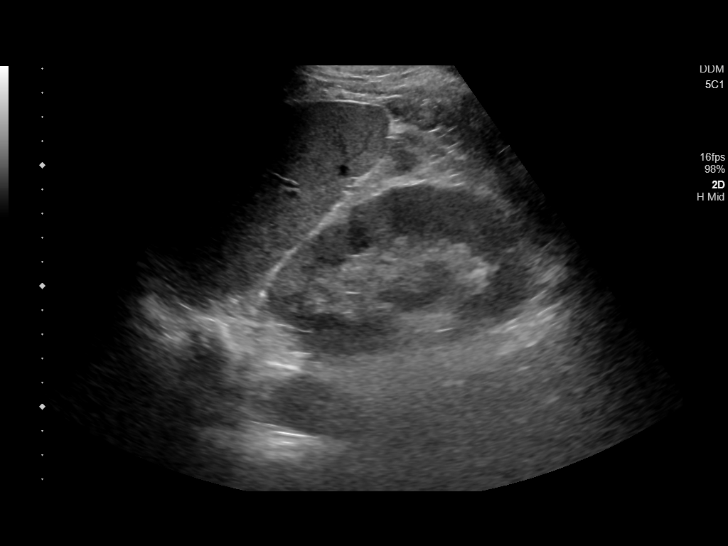
[im 66/79]
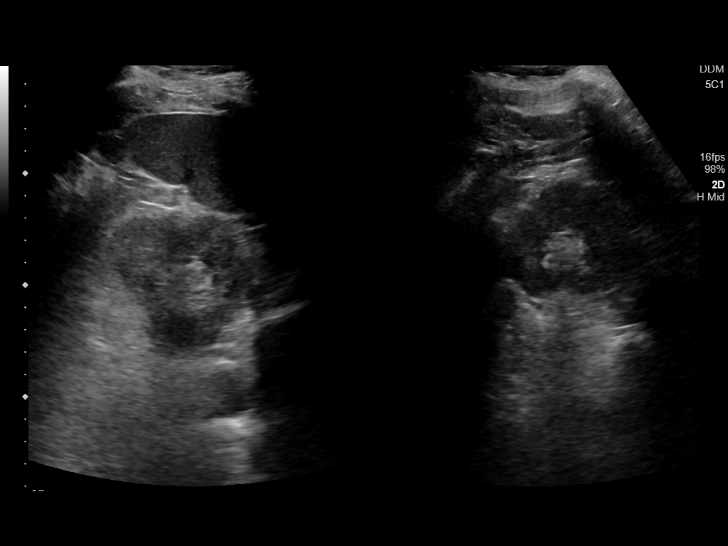
[im 72/79]
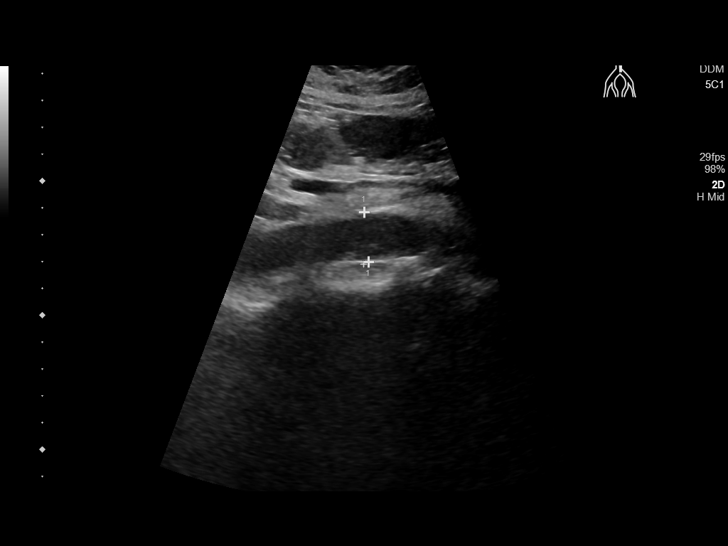
[im 79/79]
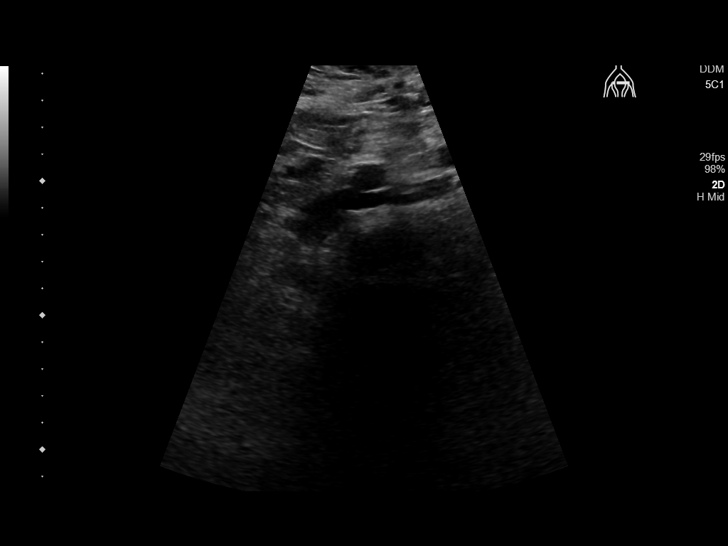

[14 of 25 positions shown; findings below may reference images not displayed]

FINDINGS: Gallbladder: Prior cholecystectomy

Common bile duct: Diameter: Normal caliber, 4 mm

Liver: Coarsened echotexture. No focal hepatic abnormality. Portal
vein is patent on color Doppler imaging with normal direction of
blood flow towards the liver.

IVC: No abnormality visualized.

Pancreas: Visualized portion unremarkable.

Spleen: Size and appearance within normal limits.

Right Kidney: Length: 11.7 cm. Echogenicity within normal limits. No
mass or hydronephrosis visualized.

Left Kidney: Length: 11.4 cm. Echogenicity within normal limits. No
mass or hydronephrosis visualized.

Abdominal aorta: No aneurysm visualized.

Other findings: None.
IMPRESSION: Coarsened echotexture throughout the liver compatible with given
history of cirrhosis. No focal hepatic abnormality.

Prior cholecystectomy.

No acute findings.

## 2018-11-11 ENCOUNTER — Inpatient Hospital Stay: Payer: Medicare Other | Attending: Oncology

## 2018-11-11 ENCOUNTER — Other Ambulatory Visit: Payer: Self-pay | Admitting: *Deleted

## 2018-11-11 DIAGNOSIS — R7989 Other specified abnormal findings of blood chemistry: Secondary | ICD-10-CM | POA: Diagnosis not present

## 2018-11-11 LAB — CBC WITH DIFFERENTIAL/PLATELET
Abs Immature Granulocytes: 0.02 10*3/uL (ref 0.00–0.07)
BASOS ABS: 0 10*3/uL (ref 0.0–0.1)
Basophils Relative: 1 %
Eosinophils Absolute: 0.1 10*3/uL (ref 0.0–0.5)
Eosinophils Relative: 2 %
HEMATOCRIT: 43.3 % (ref 39.0–52.0)
Hemoglobin: 15.3 g/dL (ref 13.0–17.0)
IMMATURE GRANULOCYTES: 0 %
LYMPHS ABS: 1.7 10*3/uL (ref 0.7–4.0)
LYMPHS PCT: 25 %
MCH: 31.2 pg (ref 26.0–34.0)
MCHC: 35.3 g/dL (ref 30.0–36.0)
MCV: 88.2 fL (ref 80.0–100.0)
MONOS PCT: 5 %
Monocytes Absolute: 0.4 10*3/uL (ref 0.1–1.0)
NEUTROS PCT: 67 %
NRBC: 0 % (ref 0.0–0.2)
Neutro Abs: 4.6 10*3/uL (ref 1.7–7.7)
Platelets: 129 10*3/uL — ABNORMAL LOW (ref 150–400)
RBC: 4.91 MIL/uL (ref 4.22–5.81)
RDW: 11.9 % (ref 11.5–15.5)
WBC: 6.9 10*3/uL (ref 4.0–10.5)

## 2018-11-11 LAB — COMPREHENSIVE METABOLIC PANEL
ALBUMIN: 4.7 g/dL (ref 3.5–5.0)
ALK PHOS: 99 U/L (ref 38–126)
ALT: 40 U/L (ref 0–44)
ANION GAP: 9 (ref 5–15)
AST: 34 U/L (ref 15–41)
BUN: 21 mg/dL (ref 8–23)
CALCIUM: 10 mg/dL (ref 8.9–10.3)
CHLORIDE: 102 mmol/L (ref 98–111)
CO2: 28 mmol/L (ref 22–32)
CREATININE: 0.82 mg/dL (ref 0.61–1.24)
GFR calc non Af Amer: >60 mL/min (ref >60)
GLUCOSE: 255 mg/dL — AB (ref 70–99)
Potassium: 3.6 mmol/L (ref 3.5–5.1)
SODIUM: 139 mmol/L (ref 135–145)
Total Bilirubin: 1.2 mg/dL (ref 0.3–1.2)
Total Protein: 7.6 g/dL (ref 6.5–8.1)

## 2018-11-11 LAB — FERRITIN: Ferritin: 25 ng/mL (ref 24–336)

## 2018-11-13 ENCOUNTER — Inpatient Hospital Stay: Payer: Medicare Other | Admitting: Oncology

## 2018-11-13 ENCOUNTER — Inpatient Hospital Stay: Payer: Medicare Other

## 2018-11-18 IMAGING — US US ABDOMEN COMPLETE W/ ELASTOGRAPHY
1 series · 13 of 25 positions shown · non-contrast
Comparison: None.

CLINICAL DATA: Abnormal LFTs



[Series 1: us abdomen complete w/ elastography · 0.28mm/px · 13 of 129 slices shown]
[im 1/129]
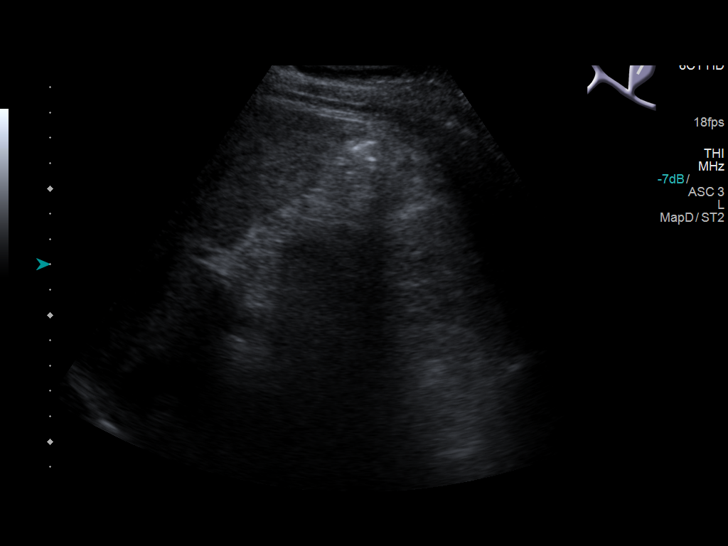
[im 11/129]
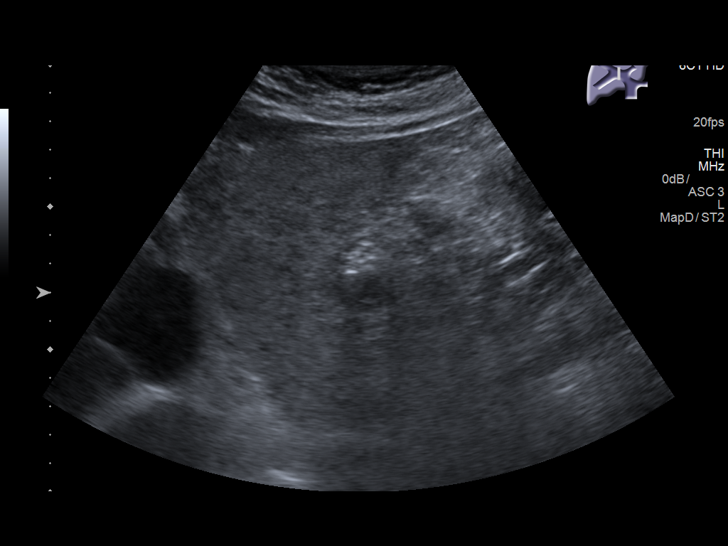
[im 22/129]
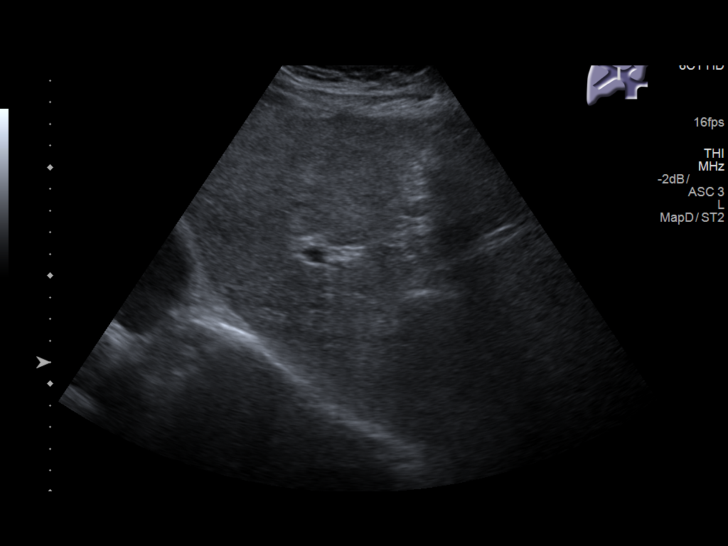
[im 33/129]
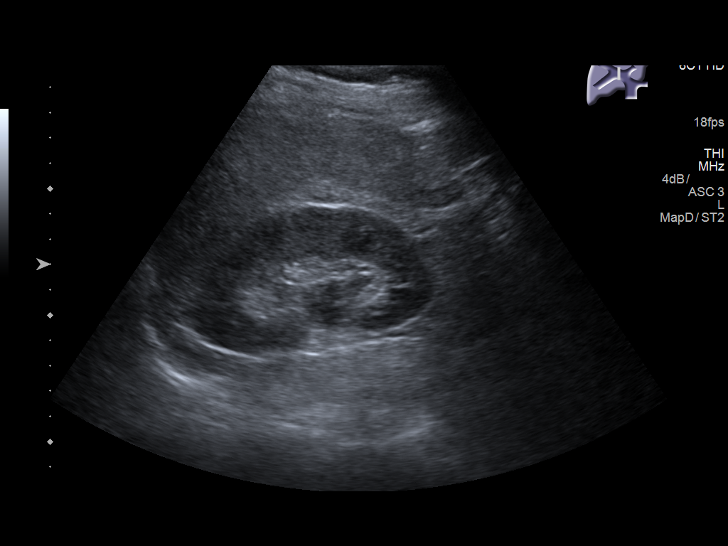
[im 43/129]
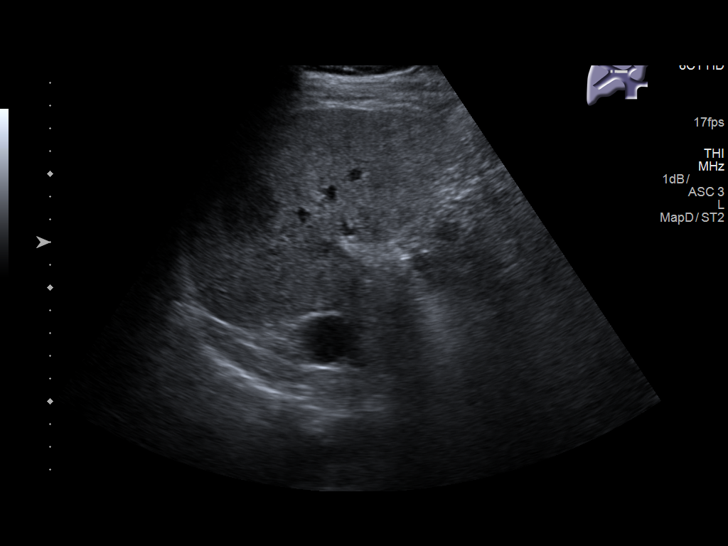
[im 54/129]
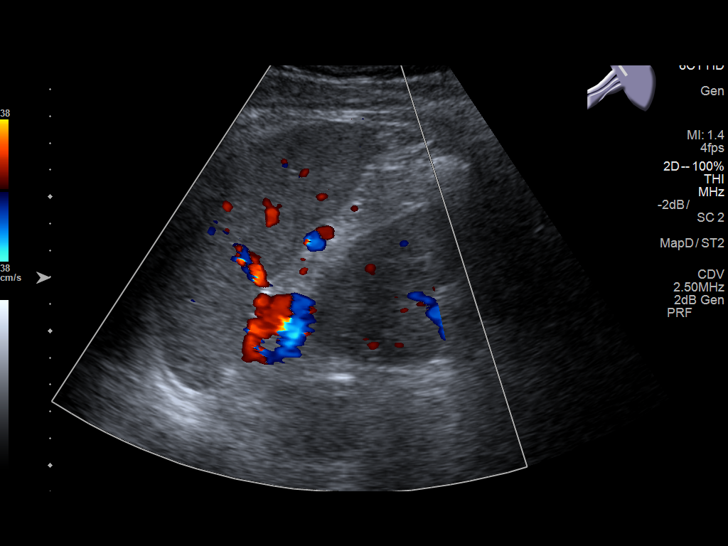
[im 65/129]
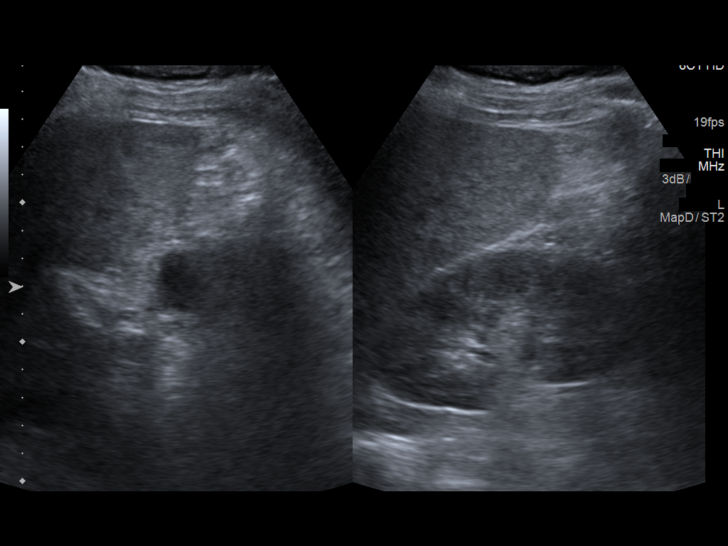
[im 75/129]
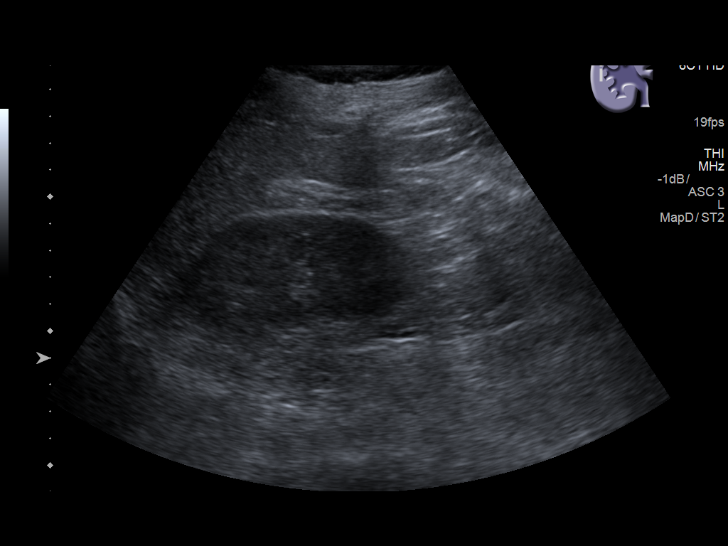
[im 86/129]
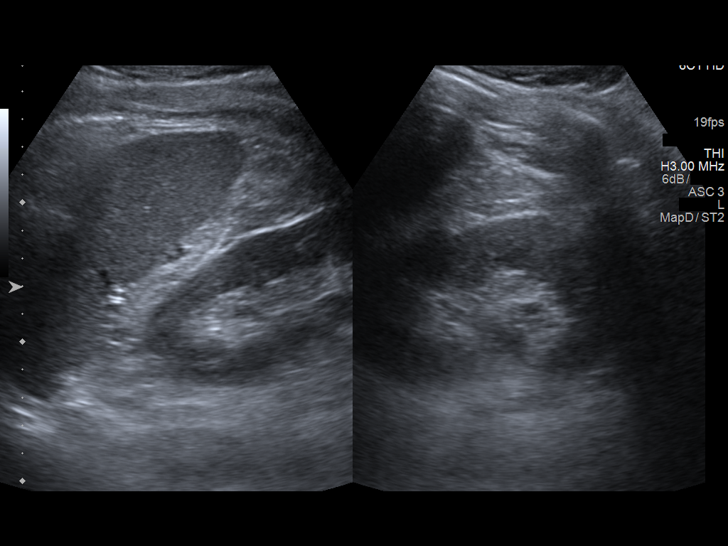
[im 97/129]
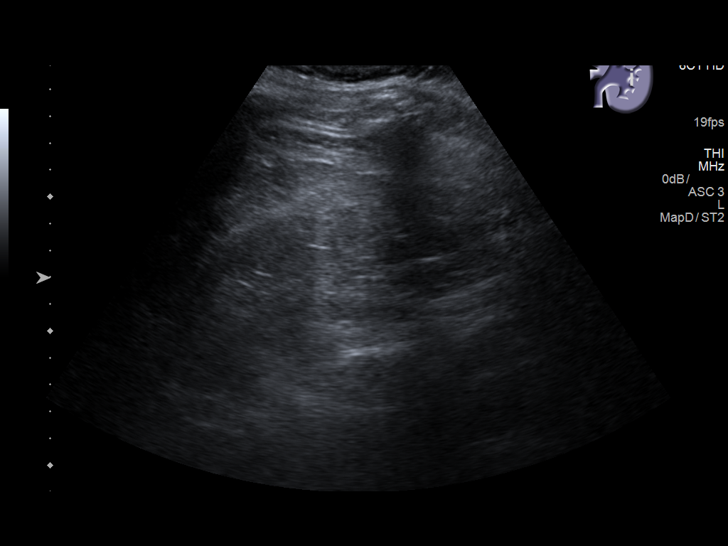
[im 107/129]
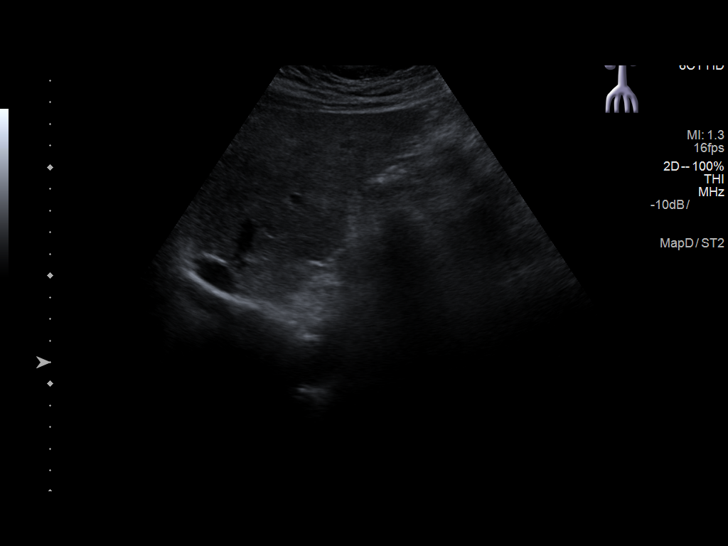
[im 118/129]
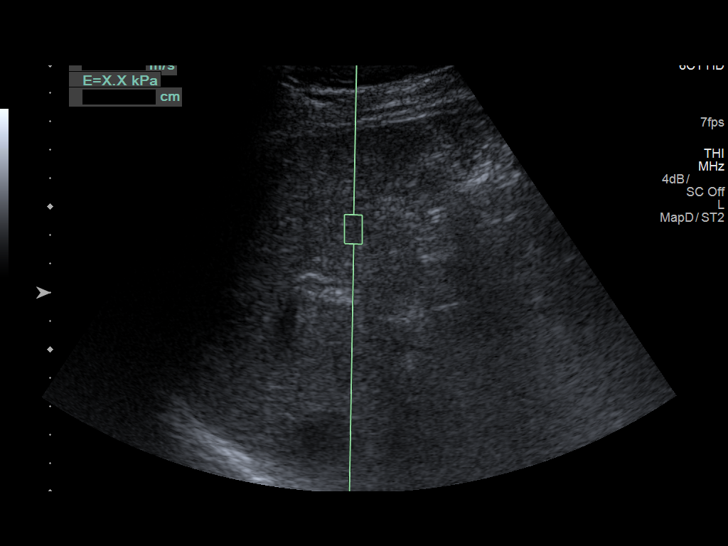
[im 129/129]
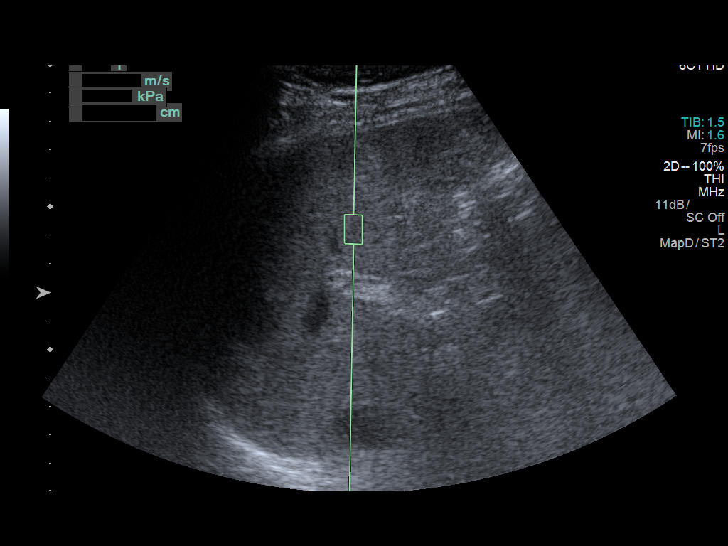

[13 of 25 positions shown; findings below may reference images not displayed]

FINDINGS: ULTRASOUND ABDOMEN

Gallbladder: Prior cholecystectomy

Common bile duct: Diameter: Normal caliber, 3 mm

Liver: Coarsened echotexture throughout the liver. There appear to
be mildly nodular contours suggesting cirrhosis. Hypoechoic mass
noted in the right hepatic lobe measures 4.3 x 3.4 x 3.3 cm in
appears solid. Portal vein is patent on color Doppler imaging with
normal direction of blood flow towards the liver.

IVC: No abnormality visualized.

Pancreas: Visualized portion unremarkable.

Spleen: Size and appearance within normal limits.

Right Kidney: Length: 10.4 cm. Echogenicity within normal limits. No
mass or hydronephrosis visualized.

Left Kidney: Length: 11.5 cm. Echogenicity within normal limits. No
mass or hydronephrosis visualized.

Abdominal aorta: No aneurysm visualized.

Other findings: None.

ULTRASOUND HEPATIC ELASTOGRAPHY

Device: Siemens Helix VTQ

Patient position: Supine

Transducer 6C1

Number of measurements: 10

Hepatic segment:  8

Median velocity:   2.34  m/sec

IQR:

IQR/Median velocity ratio:

Corresponding Metavir fibrosis score:  Some F3 + F4

Risk of fibrosis: High

Limitations of exam: None

Pertinent findings noted on other imaging exams:  None

Please note that abnormal shear wave velocities may also be
identified in clinical settings other than with hepatic fibrosis,
such as: acute hepatitis, elevated right heart and central venous
pressures including use of beta blockers, Hormaza disease
(Amazigh), infiltrative processes such as
mastocytosis/amyloidosis/infiltrative tumor, extrahepatic
cholestasis, in the post-prandial state, and liver transplantation.
Correlation with patient history, laboratory data, and clinical
condition recommended.
IMPRESSION: ULTRASOUND ABDOMEN:
Coarsened echotexture throughout the liver with nodular contour
suggesting cirrhosis. There is a 4.3 cm hypoechoic solid-appearing
mass in the right hepatic lobe. Recommend further evaluation with
MRI.

ULTRASOUND HEPATIC ELASTOGRAPHY:

Median hepatic shear wave velocity is calculated at 2.34 m/sec.

Corresponding Metavir fibrosis score is  Some F3 + F4.

Risk of fibrosis is High.

Follow-up: Follow up advised

## 2018-12-05 IMAGING — RF DG ESOPHAGUS
11 of 13 series · 14 of 17 positions shown · non-contrast
Comparison: Chest x-ray 01/18/2008.

CLINICAL DATA: Dysphagia.

EXAM:
ESOPHOGRAM / BARIUM SWALLOW / BARIUM TABLET STUDY
TECHNIQUE: Combined double contrast and single contrast examination performed
using effervescent crystals, thick barium liquid, and thin barium
liquid. The patient was observed with fluoroscopy swallowing a 13 mm
barium sulphate tablet.
FLUOROSCOPY TIME:  Fluoroscopy Time:  1 minutes 48 seconds
Radiation Exposure Index (if provided by the fluoroscopic device):
23
Number of Acquired Spot Images: 28.2 mGy

[Series 1: fluoro_barium 2fps_bw · 0.19mm/px · 2 of 6 frames shown (1 of 11)]
[frame 1/6]
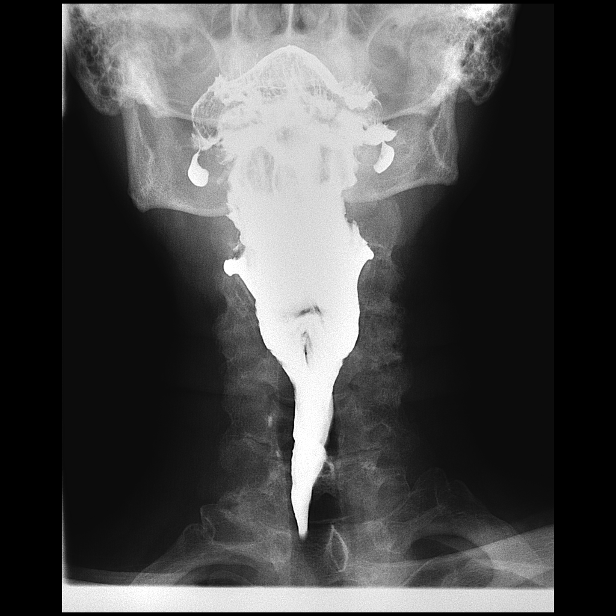
[frame 4/6]
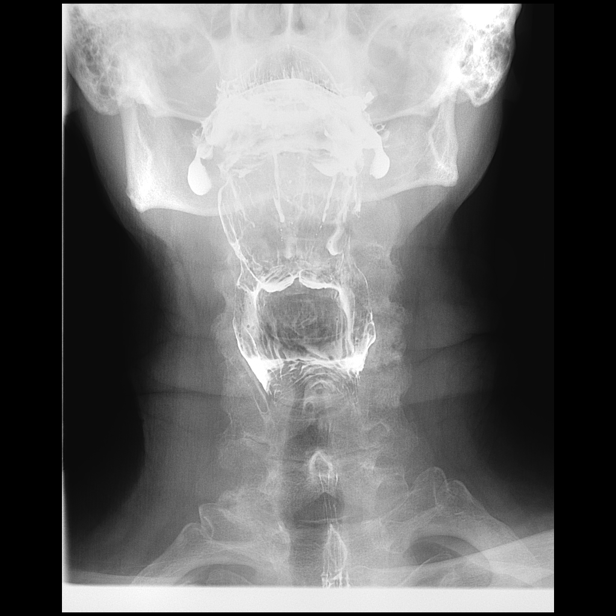

[Series 2: fluoro_barium 2fps_bw · 0.19mm/px · 3 of 6 frames shown (2 of 11)]
[frame 1/6]
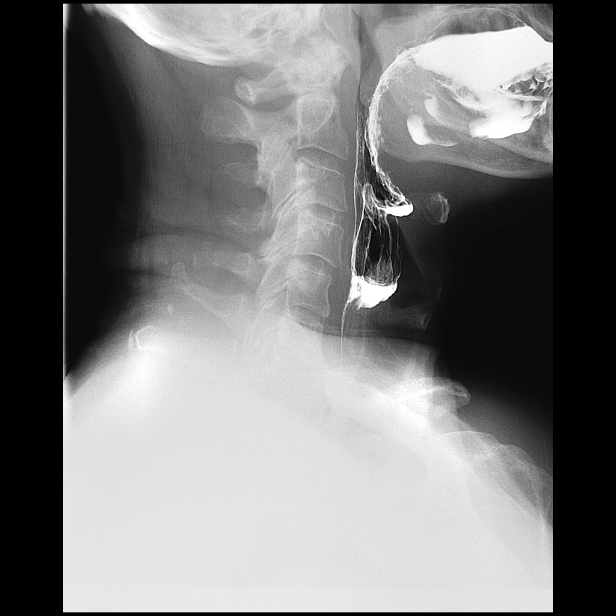
[frame 4/6]
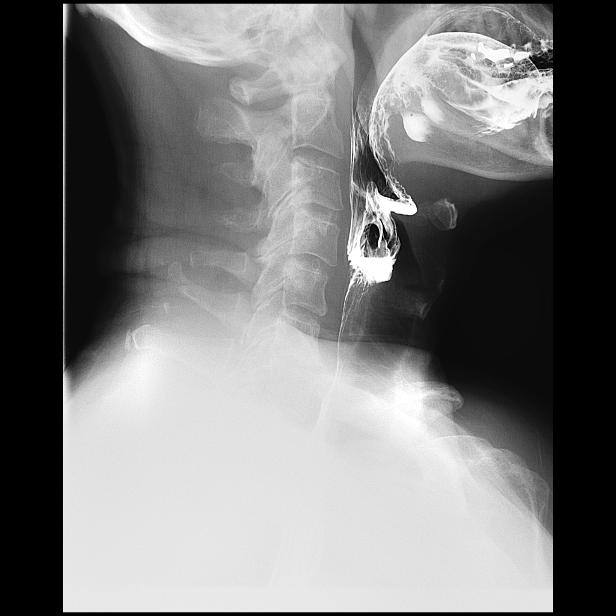
[frame 6/6]
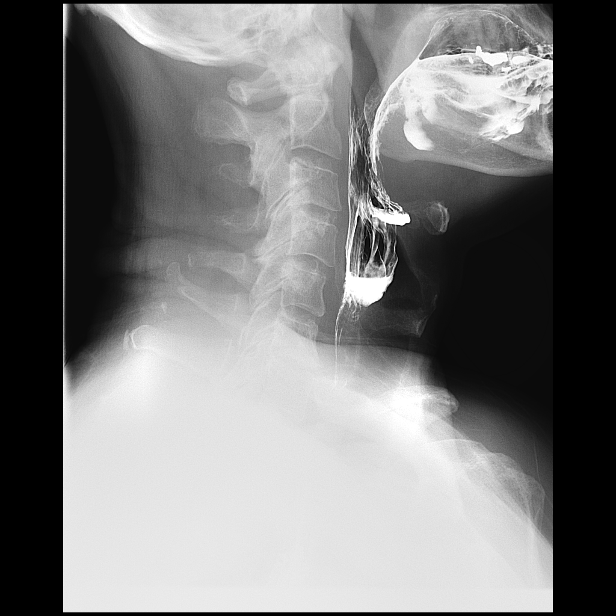

[Series 3: fluoro_barium 2fps_bw · 0.19mm/px · 1 of 1 slices shown (3 of 11)]
[im 1/1]
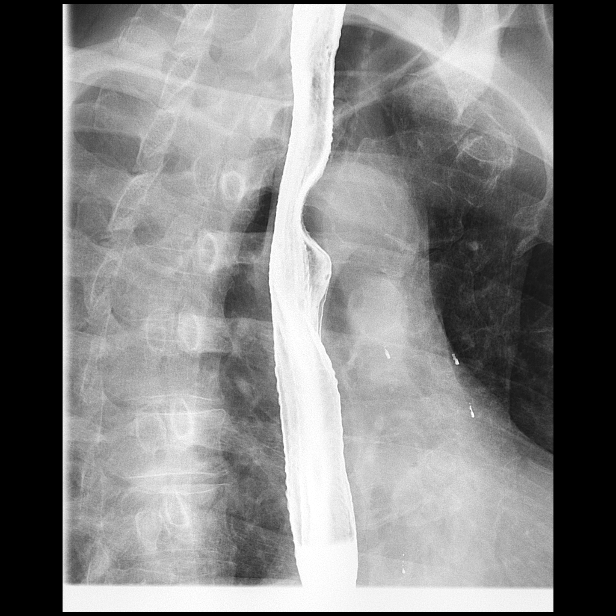

[Series 4: fluoro_barium 2fps_bw · 0.19mm/px · 1 of 1 slices shown (4 of 11)]
[im 1/1]
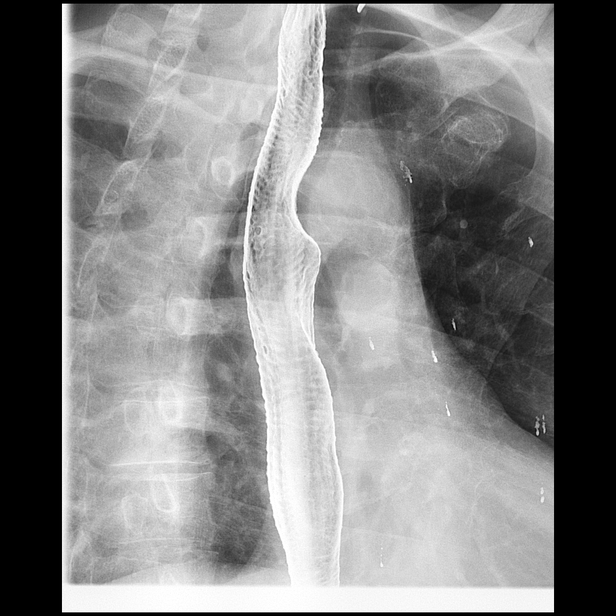

[Series 6: fluoro_barium 2fps_bw · 0.19mm/px · 1 of 1 slices shown (5 of 11)]
[im 1/1]
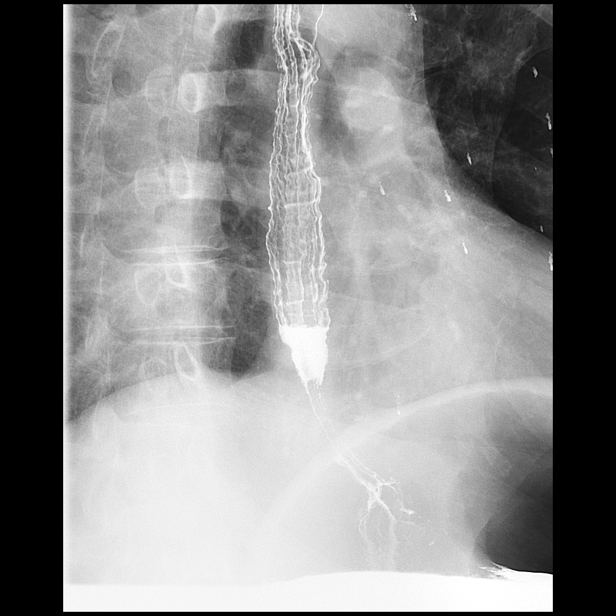

[Series 7: fluoro_barium 2fps_bw · 0.19mm/px · 1 of 1 slices shown (6 of 11)]
[im 1/1]
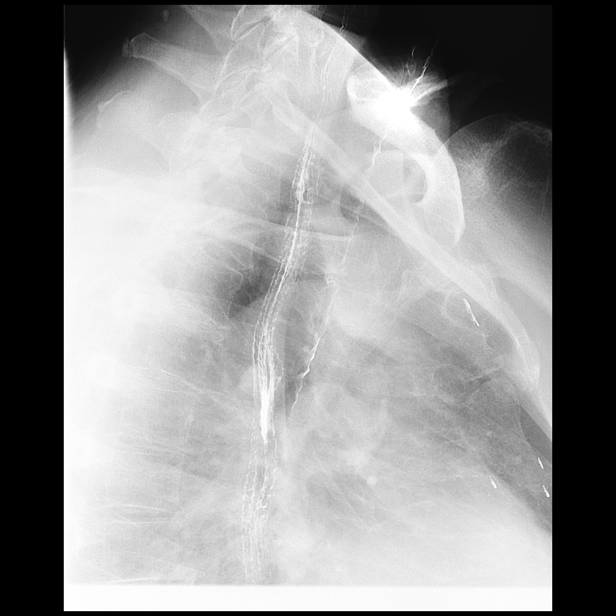

[Series 8: fluoro_barium 2fps_bw · 0.19mm/px · 1 of 1 slices shown (7 of 11)]
[im 1/1]
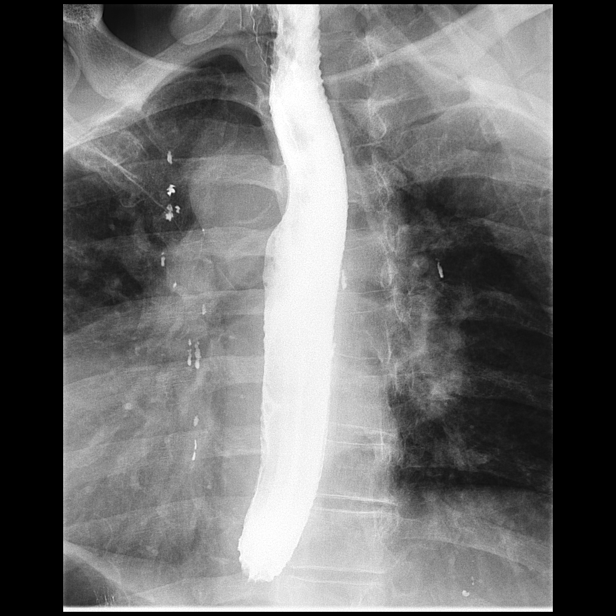

[Series 9: fluoro_barium 2fps_bw · 0.19mm/px · 1 of 1 slices shown (8 of 11)]
[im 1/1]
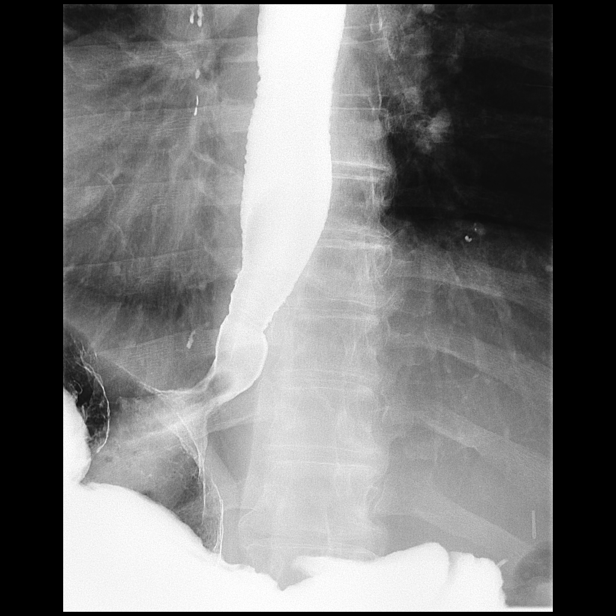

[Series 10: fluoro_barium 2fps_bw · 0.19mm/px · 1 of 1 slices shown (9 of 11)]
[im 1/1]
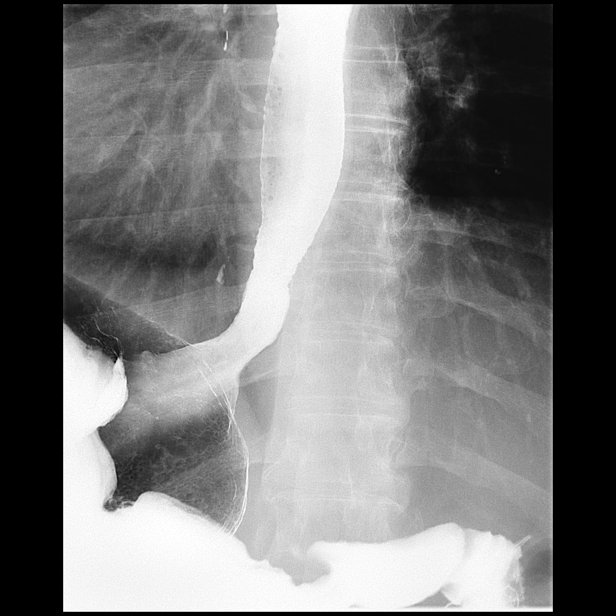

[Series 12: fluoro_barium 2fps_bw · 0.20mm/px · 1 of 1 slices shown (10 of 11)]
[im 1/1]
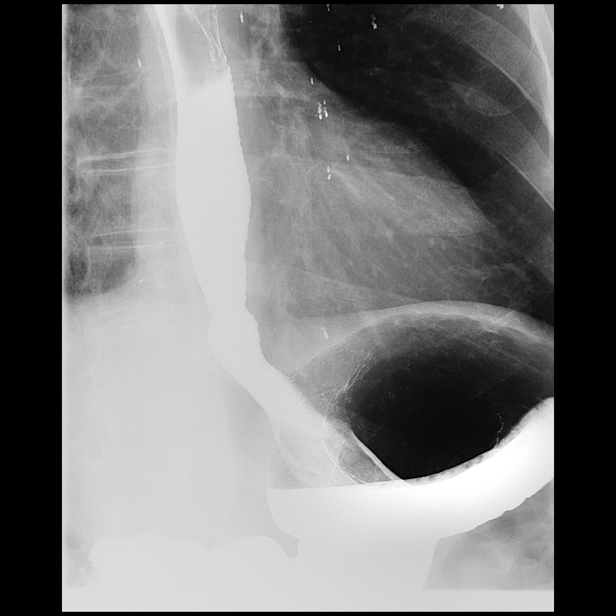

[Series 13: fluoro_barium 2fps_bw · 0.20mm/px · 1 of 1 slices shown (11 of 11)]
[im 1/1]
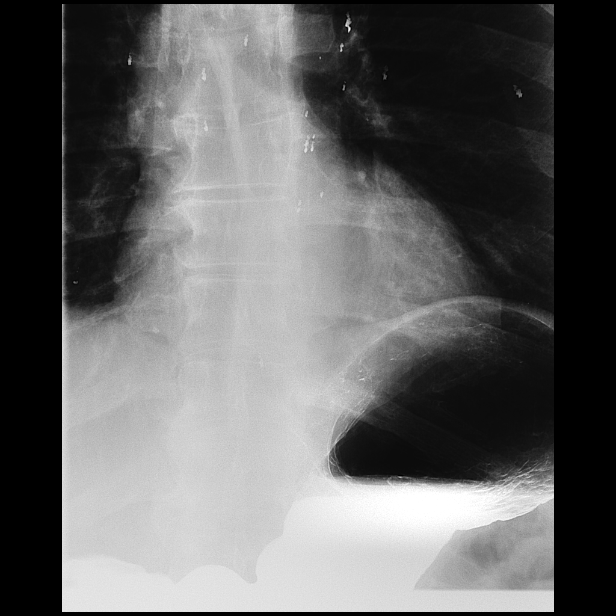

[14 of 17 positions shown; findings below may reference images not displayed]

FINDINGS: Cervical esophagus appears normal. Mild narrowing of the distal
thoracic esophagus. Endoscopic evaluation is suggested. No reflux
noted. Standardized barium tablet passes normally.
IMPRESSION: Mild narrowing of the distal thoracic esophagus above the
gastroesophageal junction. Endoscopic evaluation suggested. No
reflux noted. No obstructing lesion.

## 2018-12-18 ENCOUNTER — Other Ambulatory Visit: Payer: Self-pay | Admitting: Gastroenterology

## 2018-12-18 ENCOUNTER — Other Ambulatory Visit (HOSPITAL_COMMUNITY): Payer: Self-pay | Admitting: Gastroenterology

## 2018-12-18 DIAGNOSIS — K7469 Other cirrhosis of liver: Secondary | ICD-10-CM

## 2018-12-23 ENCOUNTER — Ambulatory Visit
Admission: RE | Admit: 2018-12-23 | Discharge: 2018-12-23 | Disposition: A | Discharge status: Home / Self Care | Payer: Medicare Other | Source: Ambulatory Visit | Attending: Gastroenterology | Admitting: Gastroenterology

## 2018-12-23 DIAGNOSIS — K7469 Other cirrhosis of liver: Secondary | ICD-10-CM | POA: Insufficient documentation

## 2019-02-06 ENCOUNTER — Encounter: Payer: Self-pay | Admitting: *Deleted

## 2019-02-09 ENCOUNTER — Ambulatory Visit
Admission: RE | Admit: 2019-02-09 | Discharge: 2019-02-09 | Disposition: A | Discharge status: Home / Self Care | Payer: Medicare Other | Source: Ambulatory Visit | Attending: Gastroenterology | Admitting: Gastroenterology

## 2019-02-09 ENCOUNTER — Encounter
Admission: RE | Disposition: A | Discharge status: Home / Self Care | Payer: Self-pay | Source: Ambulatory Visit | Attending: Gastroenterology

## 2019-02-09 ENCOUNTER — Ambulatory Visit: Payer: Medicare Other | Admitting: Certified Registered"

## 2019-02-09 ENCOUNTER — Encounter: Payer: Self-pay | Admitting: *Deleted

## 2019-02-09 ENCOUNTER — Other Ambulatory Visit: Payer: Self-pay

## 2019-02-09 DIAGNOSIS — I10 Essential (primary) hypertension: Secondary | ICD-10-CM | POA: Diagnosis not present

## 2019-02-09 DIAGNOSIS — K7460 Unspecified cirrhosis of liver: Secondary | ICD-10-CM | POA: Diagnosis not present

## 2019-02-09 DIAGNOSIS — E785 Hyperlipidemia, unspecified: Secondary | ICD-10-CM | POA: Insufficient documentation

## 2019-02-09 DIAGNOSIS — I2510 Atherosclerotic heart disease of native coronary artery without angina pectoris: Secondary | ICD-10-CM | POA: Insufficient documentation

## 2019-02-09 DIAGNOSIS — K224 Dyskinesia of esophagus: Secondary | ICD-10-CM | POA: Insufficient documentation

## 2019-02-09 DIAGNOSIS — E1151 Type 2 diabetes mellitus with diabetic peripheral angiopathy without gangrene: Secondary | ICD-10-CM | POA: Insufficient documentation

## 2019-02-09 DIAGNOSIS — Z7982 Long term (current) use of aspirin: Secondary | ICD-10-CM | POA: Diagnosis not present

## 2019-02-09 DIAGNOSIS — Z79899 Other long term (current) drug therapy: Secondary | ICD-10-CM | POA: Insufficient documentation

## 2019-02-09 DIAGNOSIS — Z794 Long term (current) use of insulin: Secondary | ICD-10-CM | POA: Diagnosis not present

## 2019-02-09 DIAGNOSIS — K210 Gastro-esophageal reflux disease with esophagitis: Secondary | ICD-10-CM | POA: Insufficient documentation

## 2019-02-09 DIAGNOSIS — M109 Gout, unspecified: Secondary | ICD-10-CM | POA: Diagnosis not present

## 2019-02-09 DIAGNOSIS — M1990 Unspecified osteoarthritis, unspecified site: Secondary | ICD-10-CM | POA: Insufficient documentation

## 2019-02-09 DIAGNOSIS — Z9049 Acquired absence of other specified parts of digestive tract: Secondary | ICD-10-CM | POA: Insufficient documentation

## 2019-02-09 DIAGNOSIS — Z791 Long term (current) use of non-steroidal anti-inflammatories (NSAID): Secondary | ICD-10-CM | POA: Insufficient documentation

## 2019-02-09 HISTORY — PX: ESOPHAGOGASTRODUODENOSCOPY (EGD) WITH PROPOFOL: SHX5813

## 2019-02-09 LAB — GLUCOSE, CAPILLARY: GLUCOSE-CAPILLARY: 103 mg/dL — AB (ref 70–99)

## 2019-02-09 SURGERY — ESOPHAGOGASTRODUODENOSCOPY (EGD) WITH PROPOFOL
Anesthesia: General

## 2019-02-09 MED ORDER — LIDOCAINE HCL (PF) 2 % IJ SOLN
INTRAMUSCULAR | Status: AC
  Filled 2019-02-09: qty 10

## 2019-02-09 MED ORDER — GLYCOPYRROLATE 0.2 MG/ML IJ SOLN
INTRAMUSCULAR | Status: DC | PRN
  Administered 2019-02-09: 0.2 mg via INTRAVENOUS

## 2019-02-09 MED ORDER — PROPOFOL 500 MG/50ML IV EMUL
INTRAVENOUS | Status: DC | PRN
  Administered 2019-02-09: 100 ug/kg/min via INTRAVENOUS

## 2019-02-09 MED ORDER — FENTANYL CITRATE (PF) 100 MCG/2ML IJ SOLN
INTRAMUSCULAR | Status: DC | PRN
  Administered 2019-02-09: 25 ug via INTRAVENOUS

## 2019-02-09 MED ORDER — PROPOFOL 10 MG/ML IV BOLUS
INTRAVENOUS | Status: AC
  Filled 2019-02-09: qty 40

## 2019-02-09 MED ORDER — LIDOCAINE HCL (CARDIAC) PF 100 MG/5ML IV SOSY
PREFILLED_SYRINGE | INTRAVENOUS | Status: DC | PRN
  Administered 2019-02-09: 100 mg via INTRATRACHEAL

## 2019-02-09 MED ORDER — SODIUM CHLORIDE 0.9 % IV SOLN
INTRAVENOUS | Status: DC
  Administered 2019-02-09: 1000 mL via INTRAVENOUS

## 2019-02-09 MED ORDER — PROPOFOL 10 MG/ML IV BOLUS
INTRAVENOUS | Status: DC | PRN
  Administered 2019-02-09: 50 mg via INTRAVENOUS
  Administered 2019-02-09: 20 mg via INTRAVENOUS
  Administered 2019-02-09: 30 mg via INTRAVENOUS

## 2019-02-09 MED ORDER — FENTANYL CITRATE (PF) 100 MCG/2ML IJ SOLN
INTRAMUSCULAR | Status: AC
  Filled 2019-02-09: qty 2

## 2019-02-09 MED ORDER — GLYCOPYRROLATE 0.2 MG/ML IJ SOLN
INTRAMUSCULAR | Status: AC
  Filled 2019-02-09: qty 1

## 2019-02-09 NOTE — Transfer of Care (Signed)
Immediate Anesthesia Transfer of Care Note  Patient: Gregory Cruz.  Procedure(s) Performed: ESOPHAGOGASTRODUODENOSCOPY (EGD) WITH PROPOFOL (N/A )  Patient Location: Endoscopy Unit  Anesthesia Type:General  Level of Consciousness: awake  Airway & Oxygen Therapy: Patient Spontanous Breathing and Patient connected to nasal cannula oxygen  Post-op Assessment: Report given to RN and Post -op Vital signs reviewed and stable  Post vital signs: stable  Last Vitals:  Vitals Value Taken Time  BP 111/84 02/09/2019  8:01 AM  Temp 36.1 C 02/09/2019  8:00 AM  Pulse 80 02/09/2019  8:03 AM  Resp 16 02/09/2019  8:03 AM  SpO2 98 % 02/09/2019  8:03 AM  Vitals shown include unvalidated device data.  Last Pain:  Vitals:   02/09/19 0800  TempSrc: Tympanic  PainSc: 0-No pain         Complications: No apparent anesthesia complications

## 2019-02-09 NOTE — Anesthesia Preprocedure Evaluation (Signed)
Anesthesia Evaluation  Patient identified by MRN, date of birth, ID band Patient awake    Reviewed: Allergy & Precautions, H&P , NPO status , Patient's Chart, lab work & pertinent test results, reviewed documented beta blocker date and time   History of Anesthesia Complications Negative for: history of anesthetic complications  Airway Mallampati: II   Neck ROM: full    Dental  (+) Poor Dentition   Pulmonary neg pulmonary ROS,           Cardiovascular Exercise Tolerance: Good hypertension, On Medications (-) angina+ CAD and + Peripheral Vascular Disease  (-) Past MI, (-) Cardiac Stents and (-) CABG negative cardio ROS  (-) dysrhythmias (-) Valvular Problems/Murmurs     Neuro/Psych negative neurological ROS  negative psych ROS   GI/Hepatic Neg liver ROS, GERD  ,  Endo/Other  diabetes  Renal/GU Renal diseasenegative Renal ROS  negative genitourinary   Musculoskeletal   Abdominal   Peds  Hematology negative hematology ROS (+)   Anesthesia Other Findings Past Medical History: No date: Abnormal LFTs No date: Arthritis     Comment:  osteoarthritis No date: Coronary artery disease No date: Diabetes mellitus without complication (HCC)     Comment:  type 2 No date: Fever blister No date: GERD (gastroesophageal reflux disease) No date: Gout No date: Hemochromatosis No date: Hyperlipidemia No date: Hypertension No date: Liver cirrhosis (HCC) No date: Peripheral vascular disease (HCC) Past Surgical History: No date: CHOLECYSTECTOMY No date: COLONOSCOPY 11/22/2017: COLONOSCOPY WITH PROPOFOL; N/A     Comment:  Procedure: COLONOSCOPY WITH PROPOFOL;  Surgeon:               Christena Deem, MD;  Location: [facility] ENDOSCOPY;                Service: Endoscopy;  Laterality: N/A; 03/2017: MOUTH SURGERY No date: ROTATOR CUFF REPAIR     Comment:  bilateral BMI    Body Mass Index:  25.77 kg/m     Reproductive/Obstetrics negative OB ROS                             Anesthesia Physical  Anesthesia Plan  ASA: III  Anesthesia Plan: General   Post-op Pain Management:    Induction: Intravenous  PONV Risk Score and Plan: 2 and Propofol infusion and TIVA  Airway Management Planned: Natural Airway and Nasal Cannula  Additional Equipment:   Intra-op Plan:   Post-operative Plan:   Informed Consent: I have reviewed the patients History and Physical, chart, labs and discussed the procedure including the risks, benefits and alternatives for the proposed anesthesia with the patient or authorized representative who has indicated his/her understanding and acceptance.     Dental Advisory Given  Plan Discussed with: CRNA  Anesthesia Plan Comments:         Anesthesia Quick Evaluation

## 2019-02-09 NOTE — H&P (Signed)
Outpatient short stay form Pre-procedure 02/09/2019 7:32 AM Christena Deem MD  Primary Physician: Dr. 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 At that time he had problems with dysphagia, but states that since his last visit this is much improved, he is currently taking a PPI daily and has no heartburn or dysphagia. His last platelet count is 162 and INR is 1.0.    Current Facility-Administered Medications:  .  0.9 %  sodium chloride infusion, , Intravenous, Continuous, Christena Deem, MD, Last Rate: 20 mL/hr at 02/09/19 0731, 1,000 mL at 02/09/19 0731  Medications Prior to Admission  Medication Sig Dispense Refill Last Dose  . Insulin Degludec (TRESIBA FLEXTOUCH) 200 UNIT/ML SOPN Inject 10-40 Units into the skin every evening.     . pantoprazole (PROTONIX) 40 MG tablet Take 40 mg by mouth daily.     Marland Kitchen aspirin EC 81 MG tablet Take 81 mg by mouth daily.   Past Week at Unknown time  . atorvastatin (LIPITOR) 20 MG tablet Take 20 mg by mouth daily.   07/30/2018 at Unknown time  . Dulaglutide (TRULICITY) 1.5 MG/0.5ML SOPN Inject 1.5 mg into the skin once a week.   Past Week at Unknown time  . glipiZIDE (GLUCOTROL XL) 10 MG 24 hr tablet Take 1 tablet (10 mg total) by mouth daily with breakfast. 30 tablet 2 07/30/2018 at Unknown time  . ibuprofen (ADVIL,MOTRIN) 200 MG tablet Take 200 mg by mouth every 6 (six) hours as needed.   Past Week at Unknown time  . lisinopril (PRINIVIL,ZESTRIL) 20 MG tablet Take 20 mg by mouth daily.   07/30/2018 at Unknown time  . metFORMIN (GLUMETZA) 500 MG (MOD) 24 hr tablet Take 2,000 mg by mouth daily with lunch.   07/30/2018 at Unknown time  . vitamin B-12 (CYANOCOBALAMIN) 1000 MCG tablet Take 1,000 mcg by mouth daily.   Not Taking at Unknown time     Allergies  Allergen Reactions  . Bee Venom Swelling  . Empagliflozin  Other (See Comments)    Frequent urination     Past Medical History:  Diagnosis Date  . Abnormal LFTs   . Arthritis    osteoarthritis  . Coronary artery disease   . Diabetes mellitus without complication (HCC)    type 2  . Fever blister   . GERD (gastroesophageal reflux disease)   . Gout   . Hemochromatosis   . Hyperlipidemia   . Hypertension   . Liver cirrhosis (HCC)   . Peripheral vascular disease (HCC)     Review of systems:      Physical Exam    Heart and lungs: Rate and rhythm without rub or gallop, lungs are bilaterally clear    HEENT: Normocephalic atraumatic eyes are anicteric    Other:    Pertinant exam for procedure: Soft nontender nondistended bowel sounds positive normoactive    Planned proceedures: EGD and indicated procedures I have discussed the risks benefits and complications of procedures to include not limited to bleeding, infection, perforation and the risk of sedation and the patient wishes to proceed.    Christena Deem, MD Gastroenterology 02/09/2019  7:32 AM

## 2019-02-09 NOTE — Op Note (Signed)
[facility] Gastroenterology Patient Name: Gregory Cruz Procedure Date: 02/09/2019 7:21 AM MRN: [id number] Account #: [id number] Date of Birth: [birth year] Admit Type: Outpatient Age: 68 Room: [facility] ENDO ROOM 3 Gender: Male Note Status: Finalized Procedure:            Upper GI endoscopy Indications:          Follow-up of esophagitis, Suspected eosinophilic                        esophagitis, Exclusion of eosinophilic esophagitis Providers:            Christena Deem, MD Medicines:            Monitored Anesthesia Care Complications:        No immediate complications. Procedure:            Pre-Anesthesia Assessment:                       - ASA Grade Assessment: III - A patient with severe                        systemic disease.                       After obtaining informed consent, the endoscope was                        passed under direct vision. Throughout the procedure,                        the patient's blood pressure, pulse, and oxygen                        saturations were monitored continuously. The Endoscope                        was introduced through the mouth, and advanced to the                        third part of duodenum. The upper GI endoscopy was                        accomplished without difficulty. The patient tolerated                        the procedure well. Findings:      Mucosal changes including ringed esophagus were found in the upper third       of the esophagus, in the middle third of the esophagus and in the lower       third of the esophagus. There is no evidence of esophageal varices.       Esophageal findings were graded using the Eosinophilic Esophagitis       Endoscopic Reference Score (EoE-EREFS) as: Edema Grade 0 Normal       (distinct vascular markings), Rings Grade 1 Mild (subtle circumferential       ridges seen on esophageal distension), Exudates Grade 0 None (no white       lesions seen), Furrows Grade 0 None  (no vertical lines seen) and       Stricture none (no stricture found). Biopsies were obtained from the  proximal and distal esophagus with cold forceps for histology of       suspected eosinophilic esophagitis , at GE junction and25-26 cm from the       incisors.      The Z-line was irregular. Biopsies were taken with a cold forceps for       histology.      A small amount of food (residue) was found in the gastric body.      The exam of the stomach was otherwise normal, no evidence of gastric       varices.      The examined duodenum was normal. Impression:           - Esophageal mucosal changes suspicious for                        eosinophilic esophagitis. Biopsied.                       - Z-line irregular. Biopsied.                       - A small amount of food (residue) in the stomach.                       - Normal examined duodenum. Recommendation:       - Discharge patient to home. Christena Deem, MD 02/09/2019 8:01:22 AM This report has been signed electronically. Number of Addenda: 0 Note Initiated On: 02/09/2019 7:21 AM      [facility]

## 2019-02-09 NOTE — Anesthesia Post-op Follow-up Note (Signed)
Anesthesia QCDR form completed.        

## 2019-02-09 NOTE — Anesthesia Postprocedure Evaluation (Signed)
Anesthesia Post Note  Patient: Gregory Cruz.  Procedure(s) Performed: ESOPHAGOGASTRODUODENOSCOPY (EGD) WITH PROPOFOL (N/A )  Patient location during evaluation: Endoscopy Anesthesia Type: General Level of consciousness: awake and alert Pain management: pain level controlled Vital Signs Assessment: post-procedure vital signs reviewed and stable Respiratory status: spontaneous breathing, nonlabored ventilation, respiratory function stable and patient connected to nasal cannula oxygen Cardiovascular status: blood pressure returned to baseline and stable Postop Assessment: no apparent nausea or vomiting Anesthetic complications: no     Last Vitals:  Vitals:   02/09/19 0801 02/09/19 0806  BP: 111/84 123/84  Pulse: 71   Resp: (!) 8   Temp:    SpO2: 99%     Last Pain:  Vitals:   02/09/19 0829  TempSrc:   PainSc: 0-No pain                 Lenard Simmer

## 2019-02-10 LAB — SURGICAL PATHOLOGY

## 2019-02-11 ENCOUNTER — Encounter: Payer: Self-pay | Admitting: Gastroenterology

## 2019-02-23 ENCOUNTER — Other Ambulatory Visit: Payer: Self-pay | Admitting: *Deleted

## 2019-02-23 ENCOUNTER — Telehealth: Payer: Self-pay | Admitting: *Deleted

## 2019-02-23 NOTE — Telephone Encounter (Signed)
Patient called to say that he thinks he needs to have his ferritin level checked since he hasn't had it checked in a while. There are no appointments currently scheduled for him. Please advise.

## 2019-02-23 NOTE — Telephone Encounter (Signed)
Cbc cmp and ferritin and see me on Monday 3/23 morning

## 2019-03-01 ENCOUNTER — Other Ambulatory Visit: Payer: Self-pay

## 2019-03-02 ENCOUNTER — Inpatient Hospital Stay: Payer: Medicare Other | Attending: Oncology

## 2019-03-02 ENCOUNTER — Other Ambulatory Visit: Payer: Self-pay

## 2019-03-02 ENCOUNTER — Inpatient Hospital Stay: Payer: Medicare Other

## 2019-03-02 ENCOUNTER — Inpatient Hospital Stay (HOSPITAL_BASED_OUTPATIENT_CLINIC_OR_DEPARTMENT_OTHER): Payer: Medicare Other | Admitting: Oncology

## 2019-03-02 ENCOUNTER — Other Ambulatory Visit: Payer: Medicare Other

## 2019-03-02 ENCOUNTER — Encounter: Payer: Self-pay | Admitting: Oncology

## 2019-03-02 ENCOUNTER — Ambulatory Visit: Payer: Medicare Other | Admitting: Oncology

## 2019-03-02 VITALS — BP 127/87 | HR 73 | Temp 97.2°F | Resp 18 | Ht 72.0 in | Wt 184.8 lb

## 2019-03-02 VITALS — BP 129/84 | HR 73 | Resp 18

## 2019-03-02 DIAGNOSIS — I10 Essential (primary) hypertension: Secondary | ICD-10-CM | POA: Diagnosis not present

## 2019-03-02 DIAGNOSIS — R7989 Other specified abnormal findings of blood chemistry: Secondary | ICD-10-CM

## 2019-03-02 DIAGNOSIS — E1140 Type 2 diabetes mellitus with diabetic neuropathy, unspecified: Secondary | ICD-10-CM | POA: Insufficient documentation

## 2019-03-02 LAB — COMPREHENSIVE METABOLIC PANEL
ALT: 68 U/L — ABNORMAL HIGH (ref 0–44)
AST: 70 U/L — AB (ref 15–41)
Albumin: 4.2 g/dL (ref 3.5–5.0)
Alkaline Phosphatase: 120 U/L (ref 38–126)
Anion gap: 7 (ref 5–15)
BUN: 21 mg/dL (ref 8–23)
CO2: 26 mmol/L (ref 22–32)
Calcium: 9.4 mg/dL (ref 8.9–10.3)
Chloride: 104 mmol/L (ref 98–111)
Creatinine, Ser: 0.83 mg/dL (ref 0.61–1.24)
GFR calc Af Amer: >60 mL/min (ref >60)
GFR calc non Af Amer: >60 mL/min (ref >60)
Glucose, Bld: 141 mg/dL — ABNORMAL HIGH (ref 70–99)
Potassium: 4.3 mmol/L (ref 3.5–5.1)
Sodium: 137 mmol/L (ref 135–145)
Total Bilirubin: 0.8 mg/dL (ref 0.3–1.2)
Total Protein: 7.1 g/dL (ref 6.5–8.1)

## 2019-03-02 LAB — CBC
HCT: 43.4 % (ref 39.0–52.0)
Hemoglobin: 15.2 g/dL (ref 13.0–17.0)
MCH: 31.9 pg (ref 26.0–34.0)
MCHC: 35 g/dL (ref 30.0–36.0)
MCV: 91 fL (ref 80.0–100.0)
PLATELETS: 148 10*3/uL — AB (ref 150–400)
RBC: 4.77 MIL/uL (ref 4.22–5.81)
RDW: 12.2 % (ref 11.5–15.5)
WBC: 7.6 10*3/uL (ref 4.0–10.5)
nRBC: 0 % (ref 0.0–0.2)

## 2019-03-02 LAB — FERRITIN: Ferritin: 65 ng/mL (ref 24–336)

## 2019-03-02 NOTE — Progress Notes (Signed)
No complaints today. Feels better now that he is on insulin and hgb a1c is lower

## 2019-03-03 ENCOUNTER — Other Ambulatory Visit: Payer: Self-pay | Admitting: *Deleted

## 2019-03-04 NOTE — Progress Notes (Signed)
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 He has been referred to [place] for elevated ferritin. His recent bloodwork from 05/21/2017 was as follows CMP showed elevated blood sugar of 211, elevated AST and ALT of 54 and 87 respectively. He has been seen by [facility] gastro[facility] back in 2016 for elevated liver function tests as well but has not followed up with them since then. Most recent ferritin I have for him is from January 2017 which was elevated at 1010. In April 2016 his ferritin was 918. Hepatitis C testing in 2017 was negative. Recent TSH on February 2018 was normal. Recent CBC from July 2017 showed white count of 7.6, H&H of 16.6/46.3 with an MCV of 91.3 and a platelet count of 147.  Patient denies any alcohol use. Denies any unintentional weight loss. No family history of any blood disorders such as thalassemia. No history of any blood transfusions. 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 He also has a known hypodense lesion in his liver which was stable as compared to 2003 and compatible with a benign hemangioma. No splenomegaly or adenopathy  Results of bloodwork from 03-2017 breast follows: CBC showed white count of 8.9, H&H of 15.7/44.1 and a platelet count of 168.CMP was significant for elevated AST  and ALT of 1806, elevated blood sugar of 164 total bilirubin was normal.ferritin levels were elevated at 1807. Iron studies showed iron saturation of 34%. 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 Reports that his diabetes mediocations have been changed and blood sugars are under better control  ECOG PS- 0 Pain scale- 0   Review of systems- Review of Systems  Constitutional: Negative for chills, fever, malaise/fatigue and weight loss.  HENT: Negative for congestion, ear discharge and nosebleeds.   Eyes: Negative for blurred vision.  Respiratory: Negative for cough, hemoptysis, sputum production, shortness of breath and wheezing.   Cardiovascular: Negative for chest pain, palpitations, orthopnea and claudication.  Gastrointestinal: Negative for abdominal pain, blood in stool, constipation, diarrhea, heartburn, melena, nausea and vomiting.  Genitourinary: Negative for dysuria, flank pain, frequency, hematuria and urgency.  Musculoskeletal: Negative for back pain, joint pain and myalgias.  Skin: Negative for rash.  Neurological: Negative for dizziness, tingling, focal weakness, seizures, weakness and headaches.  Endo/Heme/Allergies: Does not bruise/bleed easily.  Psychiatric/Behavioral: Negative for depression and suicidal ideas. The patient does not have insomnia.      Allergies  Allergen Reactions  . Bee Venom Swelling  . Empagliflozin Other (See Comments)    Frequent urination- jardiance     Past Medical History:  Diagnosis Date  . Abnormal LFTs   . Arthritis    osteoarthritis  . Coronary artery disease   . Diabetes mellitus without complication (HCC)    type 2  .  Fever blister   . GERD (gastroesophageal reflux disease)   . Gout   . Hemochromatosis   . Hyperlipidemia   . Hypertension   . Liver cirrhosis (HCC)   . Peripheral vascular disease  [facility])      Past Surgical History:  Procedure Laterality Date  . CHOLECYSTECTOMY    . COLONOSCOPY    . COLONOSCOPY WITH PROPOFOL N/A 11/22/2017   Procedure: COLONOSCOPY WITH PROPOFOL;  Surgeon: Christena Deem, MD;  Location: [facility] ENDOSCOPY;  Service: Endoscopy;  Laterality: N/A;  . ESOPHAGOGASTRODUODENOSCOPY N/A 07/31/2018   Procedure: ESOPHAGOGASTRODUODENOSCOPY (EGD);  Surgeon: Christena Deem, MD;  Location: [facility] ENDOSCOPY;  Service: Endoscopy;  Laterality: N/A;  . ESOPHAGOGASTRODUODENOSCOPY (EGD) WITH PROPOFOL N/A 02/09/2019   Procedure: ESOPHAGOGASTRODUODENOSCOPY (EGD) WITH PROPOFOL;  Surgeon: Christena Deem, MD;  Location: [facility] ENDOSCOPY;  Service: Endoscopy;  Laterality: N/A;  . MOUTH SURGERY  03/2017  . ROTATOR CUFF REPAIR     bilateral    Social History   Socioeconomic History  . Marital status: Married    Spouse name: Not on file  . Number of children: Not on file  . Years of education: Not on file  . Highest education level: Not on file  Occupational History  . Not on file  Social Needs  . Financial resource strain: Not on file  . Food insecurity:    Worry: Not on file    Inability: Not on file  . Transportation needs:    Medical: Not on file    Non-medical: Not on file  Tobacco Use  . Smoking status: Never Smoker  . Smokeless tobacco: Never Used  Substance and Sexual Activity  . Alcohol use: No  . Drug use: No  . Sexual activity: Not on file  Lifestyle  . Physical activity:    Days per week: Not on file    Minutes per session: Not on file  . Stress: Not on file  Relationships  . Social connections:    Talks on phone: Not on file    Gets together: Not on file    Attends religious service: Not on file    Active member of club or organization: Not on file    Attends meetings of clubs or organizations: Not on file    Relationship status: Not on file  . Intimate partner violence:    Fear of current or ex partner: Not on file    Emotionally  abused: Not on file    Physically abused: Not on file    Forced sexual activity: Not on file  Other Topics Concern  . Not on file  Social History Narrative  . Not on file    No family history on file.   Current Outpatient Medications:  .  aspirin EC 81 MG tablet, Take 81 mg by mouth daily., Disp: , Rfl:  .  atorvastatin (LIPITOR) 20 MG tablet, Take 20 mg by mouth daily., Disp: , Rfl:  .  Dulaglutide (TRULICITY) 1.5 MG/0.5ML SOPN, Inject 1.5 mg into the skin once a week., Disp: , Rfl:  .  glipiZIDE (GLUCOTROL XL) 10 MG 24 hr tablet, Take 1 tablet (10 mg total) by mouth daily with breakfast., Disp: 30 tablet, Rfl: 2 .  ibuprofen (ADVIL,MOTRIN) 200 MG tablet, Take 200 mg by mouth every 6 (six) hours as needed., Disp: , Rfl:  .  Insulin Degludec (TRESIBA FLEXTOUCH) 200 UNIT/ML SOPN, Inject 38 Units into the skin every evening. , Disp: , Rfl:  .  lisinopril (PRINIVIL,ZESTRIL) 20 MG  tablet, Take 20 mg by mouth daily., Disp: , Rfl:  .  metFORMIN (GLUMETZA) 500 MG (MOD) 24 hr tablet, Take 2,000 mg by mouth daily with lunch., Disp: , Rfl:  .  pantoprazole (PROTONIX) 40 MG tablet, Take 40 mg by mouth daily., Disp: , Rfl:  .  vitamin B-12 (CYANOCOBALAMIN) 1000 MCG tablet, Take 1,000 mcg by mouth daily., Disp: , Rfl:   Physical exam:  Vitals:   03/02/19 0836  BP: 127/87  Pulse: 73  Resp: 18  Temp: (!) 97.2 F (36.2 C)  TempSrc: Tympanic  Weight: 184 lb 12.8 oz (83.8 kg)  Height: 6' (1.829 m)   Physical Exam Constitutional:      General: He is not in acute distress. HENT:     Head: Normocephalic and atraumatic.  Eyes:     Pupils: Pupils are equal, round, and reactive to light.  Neck:     Musculoskeletal: Normal range of motion.  Cardiovascular:     Rate and Rhythm: Normal rate and regular rhythm.     Heart sounds: Normal heart sounds.  Pulmonary:     Effort: Pulmonary effort is normal.     Breath sounds: Normal breath sounds.  Abdominal:     General: Bowel sounds are normal.      Palpations: Abdomen is soft.  Skin:    General: Skin is warm and dry.  Neurological:     Mental Status: He is alert and oriented to person, place, and time.      CMP Latest Ref Rng & Units 03/02/2019  Glucose 70 - 99 mg/dL 161(W)  BUN 8 - 23 mg/dL 21  Creatinine 9.60 - 4.54 mg/dL 0.98  Sodium 119 - 147 mmol/L 137  Potassium 3.5 - 5.1 mmol/L 4.3  Chloride 98 - 111 mmol/L 104  CO2 22 - 32 mmol/L 26  Calcium 8.9 - 10.3 mg/dL 9.4  Total Protein 6.5 - 8.1 g/dL 7.1  Total Bilirubin 0.3 - 1.2 mg/dL 0.8  Alkaline Phos 38 - 126 U/L 120  AST 15 - 41 U/L 70(H)  ALT 0 - 44 U/L 68(H)   CBC Latest Ref Rng & Units 03/02/2019  WBC 4.0 - 10.5 K/uL 7.6  Hemoglobin 13.0 - 17.0 g/dL 82.9  Hematocrit 56.2 - 52.0 % 43.4  Platelets 150 - 400 K/uL 148(L)      Assessment and plan- Patient is a 68 y.o. 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 He is here to assess need for phlebotomy  Ferritin levels were not back when patient was in my office. Last phlebotomy was in sept 2019. He has mildly abnormal LFT's today. He has been requiring phlebotomy Q3-6 months with a threshold goal of ferritin <100. Due to ongoing COVID pandemic, I would like to minimize his visits to the cancer center. I will proceed with 500 cc phlebotomy today as he is not anemic and has tolerated phlebotomy well in the past.   Repeat cbc and ferritin in 2 and 4 months and I will see him back in 4 months     Visit Diagnosis 1. Elevated ferritin   2. Iron overload      Dr. Owens Shark, MD, MPH [facility] at [facility] [phone] 03/04/2019 8:08 AM

## 2019-05-05 ENCOUNTER — Other Ambulatory Visit: Payer: Self-pay

## 2019-05-05 ENCOUNTER — Other Ambulatory Visit: Payer: Medicare Other

## 2019-05-05 ENCOUNTER — Inpatient Hospital Stay: Payer: Medicare Other | Attending: Oncology

## 2019-05-05 ENCOUNTER — Inpatient Hospital Stay: Payer: Medicare Other

## 2019-05-05 DIAGNOSIS — E232 Diabetes insipidus: Secondary | ICD-10-CM | POA: Diagnosis not present

## 2019-05-05 DIAGNOSIS — E1140 Type 2 diabetes mellitus with diabetic neuropathy, unspecified: Secondary | ICD-10-CM | POA: Diagnosis not present

## 2019-05-05 DIAGNOSIS — R7989 Other specified abnormal findings of blood chemistry: Secondary | ICD-10-CM | POA: Insufficient documentation

## 2019-05-05 DIAGNOSIS — E8881 Metabolic syndrome: Secondary | ICD-10-CM | POA: Insufficient documentation

## 2019-05-05 LAB — COMPREHENSIVE METABOLIC PANEL
ALT: 38 U/L (ref 0–44)
AST: 37 U/L (ref 15–41)
Albumin: 4.5 g/dL (ref 3.5–5.0)
Alkaline Phosphatase: 119 U/L (ref 38–126)
Anion gap: 9 (ref 5–15)
BUN: 19 mg/dL (ref 8–23)
CO2: 25 mmol/L (ref 22–32)
Calcium: 9.7 mg/dL (ref 8.9–10.3)
Chloride: 104 mmol/L (ref 98–111)
Creatinine, Ser: 0.91 mg/dL (ref 0.61–1.24)
GFR calc Af Amer: >60 mL/min (ref >60)
GFR calc non Af Amer: >60 mL/min (ref >60)
Glucose, Bld: 224 mg/dL — ABNORMAL HIGH (ref 70–99)
Potassium: 4.6 mmol/L (ref 3.5–5.1)
Sodium: 138 mmol/L (ref 135–145)
Total Bilirubin: 1 mg/dL (ref 0.3–1.2)
Total Protein: 7.1 g/dL (ref 6.5–8.1)

## 2019-05-05 LAB — CBC WITH DIFFERENTIAL/PLATELET
Abs Immature Granulocytes: 0.03 10*3/uL (ref 0.00–0.07)
Basophils Absolute: 0 10*3/uL (ref 0.0–0.1)
Basophils Relative: 0 %
Eosinophils Absolute: 0.5 10*3/uL (ref 0.0–0.5)
Eosinophils Relative: 6 %
HCT: 41.9 % (ref 39.0–52.0)
Hemoglobin: 14.6 g/dL (ref 13.0–17.0)
Immature Granulocytes: 0 %
Lymphocytes Relative: 19 %
Lymphs Abs: 1.6 10*3/uL (ref 0.7–4.0)
MCH: 31.3 pg (ref 26.0–34.0)
MCHC: 34.8 g/dL (ref 30.0–36.0)
MCV: 89.7 fL (ref 80.0–100.0)
Monocytes Absolute: 0.7 10*3/uL (ref 0.1–1.0)
Monocytes Relative: 8 %
Neutro Abs: 5.6 10*3/uL (ref 1.7–7.7)
Neutrophils Relative %: 67 %
Platelets: 132 10*3/uL — ABNORMAL LOW (ref 150–400)
RBC: 4.67 MIL/uL (ref 4.22–5.81)
RDW: 12.1 % (ref 11.5–15.5)
WBC: 8.4 10*3/uL (ref 4.0–10.5)
nRBC: 0 % (ref 0.0–0.2)

## 2019-05-05 LAB — FERRITIN: Ferritin: 25 ng/mL (ref 24–336)

## 2019-05-05 NOTE — Progress Notes (Signed)
Reviewed 03/02/2019 labs with Dr. Smith Robert, today's ferritin has not resulted. Per Dr. Smith Robert no need for phlebotomy at this time. Pt aware and stable at discharge.

## 2019-07-06 ENCOUNTER — Other Ambulatory Visit: Payer: Medicare Other

## 2019-07-06 ENCOUNTER — Inpatient Hospital Stay: Payer: Medicare Other

## 2019-07-06 ENCOUNTER — Inpatient Hospital Stay: Payer: Medicare Other | Attending: Oncology

## 2019-07-06 ENCOUNTER — Other Ambulatory Visit: Payer: Self-pay

## 2019-07-06 ENCOUNTER — Other Ambulatory Visit: Payer: Self-pay | Admitting: *Deleted

## 2019-07-06 ENCOUNTER — Telehealth: Payer: Self-pay | Admitting: *Deleted

## 2019-07-06 DIAGNOSIS — Z79899 Other long term (current) drug therapy: Secondary | ICD-10-CM | POA: Insufficient documentation

## 2019-07-06 DIAGNOSIS — E119 Type 2 diabetes mellitus without complications: Secondary | ICD-10-CM | POA: Insufficient documentation

## 2019-07-06 DIAGNOSIS — R7989 Other specified abnormal findings of blood chemistry: Secondary | ICD-10-CM | POA: Diagnosis not present

## 2019-07-06 DIAGNOSIS — R7889 Finding of other specified substances, not normally found in blood: Secondary | ICD-10-CM | POA: Diagnosis present

## 2019-07-06 LAB — CBC WITH DIFFERENTIAL/PLATELET
Abs Immature Granulocytes: 0.02 10*3/uL (ref 0.00–0.07)
Basophils Absolute: 0 10*3/uL (ref 0.0–0.1)
Basophils Relative: 0 %
Eosinophils Absolute: 0.1 10*3/uL (ref 0.0–0.5)
Eosinophils Relative: 2 %
HCT: 41.7 % (ref 39.0–52.0)
Hemoglobin: 14.8 g/dL (ref 13.0–17.0)
Immature Granulocytes: 0 %
Lymphocytes Relative: 20 %
Lymphs Abs: 1.7 10*3/uL (ref 0.7–4.0)
MCH: 31.1 pg (ref 26.0–34.0)
MCHC: 35.5 g/dL (ref 30.0–36.0)
MCV: 87.6 fL (ref 80.0–100.0)
Monocytes Absolute: 0.5 10*3/uL (ref 0.1–1.0)
Monocytes Relative: 6 %
Neutro Abs: 6 10*3/uL (ref 1.7–7.7)
Neutrophils Relative %: 72 %
Platelets: 135 10*3/uL — ABNORMAL LOW (ref 150–400)
RBC: 4.76 MIL/uL (ref 4.22–5.81)
RDW: 13 % (ref 11.5–15.5)
WBC: 8.3 10*3/uL (ref 4.0–10.5)
nRBC: 0 % (ref 0.0–0.2)

## 2019-07-06 LAB — FERRITIN: Ferritin: 38 ng/mL (ref 24–336)

## 2019-07-06 NOTE — Telephone Encounter (Signed)
Called to let pt know that he does not need to have phlebotomy today. I had let him go home because it was going to b 30 min. Later to get lab results. I asked him while on phone if he came 2 different days and he did and I am going to change his appts to do that same thing in future mon-lab and tues phlebotomy. He is agreeable to this and he is ok if I mail him the new appts

## 2019-09-07 ENCOUNTER — Other Ambulatory Visit: Payer: Self-pay

## 2019-09-07 ENCOUNTER — Encounter: Payer: Self-pay | Admitting: Oncology

## 2019-09-07 ENCOUNTER — Inpatient Hospital Stay: Payer: Medicare Other | Attending: Oncology

## 2019-09-07 ENCOUNTER — Inpatient Hospital Stay (HOSPITAL_BASED_OUTPATIENT_CLINIC_OR_DEPARTMENT_OTHER): Payer: Medicare Other | Admitting: Oncology

## 2019-09-07 ENCOUNTER — Telehealth: Payer: Self-pay | Admitting: *Deleted

## 2019-09-07 DIAGNOSIS — R7989 Other specified abnormal findings of blood chemistry: Secondary | ICD-10-CM | POA: Insufficient documentation

## 2019-09-07 DIAGNOSIS — Z7984 Long term (current) use of oral hypoglycemic drugs: Secondary | ICD-10-CM | POA: Diagnosis not present

## 2019-09-07 DIAGNOSIS — K219 Gastro-esophageal reflux disease without esophagitis: Secondary | ICD-10-CM | POA: Diagnosis not present

## 2019-09-07 DIAGNOSIS — I10 Essential (primary) hypertension: Secondary | ICD-10-CM | POA: Insufficient documentation

## 2019-09-07 DIAGNOSIS — E119 Type 2 diabetes mellitus without complications: Secondary | ICD-10-CM | POA: Insufficient documentation

## 2019-09-07 DIAGNOSIS — Z791 Long term (current) use of non-steroidal anti-inflammatories (NSAID): Secondary | ICD-10-CM | POA: Diagnosis not present

## 2019-09-07 DIAGNOSIS — E785 Hyperlipidemia, unspecified: Secondary | ICD-10-CM | POA: Insufficient documentation

## 2019-09-07 DIAGNOSIS — D696 Thrombocytopenia, unspecified: Secondary | ICD-10-CM | POA: Insufficient documentation

## 2019-09-07 DIAGNOSIS — Z794 Long term (current) use of insulin: Secondary | ICD-10-CM | POA: Diagnosis not present

## 2019-09-07 DIAGNOSIS — Z79899 Other long term (current) drug therapy: Secondary | ICD-10-CM | POA: Insufficient documentation

## 2019-09-07 DIAGNOSIS — Z7982 Long term (current) use of aspirin: Secondary | ICD-10-CM | POA: Insufficient documentation

## 2019-09-07 LAB — CBC
HCT: 40.5 % (ref 39.0–52.0)
Hemoglobin: 14.4 g/dL (ref 13.0–17.0)
MCH: 31.8 pg (ref 26.0–34.0)
MCHC: 35.6 g/dL (ref 30.0–36.0)
MCV: 89.4 fL (ref 80.0–100.0)
Platelets: 114 10*3/uL — ABNORMAL LOW (ref 150–400)
RBC: 4.53 MIL/uL (ref 4.22–5.81)
RDW: 12.4 % (ref 11.5–15.5)
WBC: 5.6 10*3/uL (ref 4.0–10.5)
nRBC: 0 % (ref 0.0–0.2)

## 2019-09-07 LAB — FERRITIN: Ferritin: 26 ng/mL (ref 24–336)

## 2019-09-07 NOTE — Telephone Encounter (Signed)
Called pt and let him know that he does not need the phlebotomy tom. And we have cancelled it

## 2019-09-07 NOTE — Progress Notes (Signed)
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 He has been referred to [place] for elevated ferritin. His recent bloodwork from 05/21/2017 was as follows CMP showed elevated blood sugar of 211, elevated AST and ALT of 54 and 87 respectively. He has been seen by [place] gastro[facility]ology back in 2016 for elevated liver function tests as well but has not followed up with them since then. Most recent ferritin I have for him is from January 2017 which was elevated at 1010. In April 2016 his ferritin was 918. Hepatitis C testing in 2017 was negative. Recent TSH on February 2018 was normal. Recent CBC from July 2017 showed white count of 7.6, H&H of 16.6/46.3 with an MCV of 91.3 and a platelet count of 147.  Patient denies any alcohol use. Denies any unintentional weight loss. No family history of any blood disorders such as thalassemia. No history of any blood transfusions. 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 He also has a known hypodense lesion in his liver which was stable as compared to 2003 and compatible with a benign hemangioma. No splenomegaly or adenopathy  Results of bloodwork from 03-2017 breast follows: CBC showed white count of 8.9, H&H of 15.7/44.1 and a platelet count of  168.CMP was significant for elevated AST and ALT of 1806, elevated blood sugar of 164 total bilirubin was normal.ferritin levels were elevated at 1807. Iron studies showed iron saturation of 34%. 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  Bowel movements are regular.  Denies any fatigue or abdominal pain  ECOG PS- 0 Pain scale- 0  Review of systems- Review of Systems  Constitutional: Negative for chills, fever, malaise/fatigue and weight loss.  HENT: Negative for congestion, ear discharge and nosebleeds.   Eyes: Negative for blurred vision.  Respiratory: Negative for cough, hemoptysis, sputum production, shortness of breath and wheezing.   Cardiovascular: Negative for chest pain, palpitations, orthopnea and claudication.  Gastrointestinal: Negative for abdominal pain, blood in stool, constipation, diarrhea, heartburn, melena, nausea and vomiting.  Genitourinary: Negative for dysuria, flank pain, frequency, hematuria and urgency.  Musculoskeletal: Negative for back pain, joint pain and myalgias.  Skin: Negative for rash.  Neurological: Negative for dizziness, tingling, focal weakness, seizures, weakness and headaches.  Endo/Heme/Allergies: Does not bruise/bleed easily.  Psychiatric/Behavioral: Negative for depression and suicidal ideas. The patient does not have insomnia.       Allergies  Allergen Reactions  . Bee Venom Swelling  . Empagliflozin Other (See Comments)    Frequent urination- jardiance     Past Medical History:  Diagnosis Date  . Abnormal LFTs   . Arthritis    osteoarthritis  . Coronary artery disease   .  Diabetes mellitus without complication (HCC)    type 2  . Fever blister   . GERD (gastroesophageal reflux disease)   . Gout   . Hemochromatosis   . Hyperlipidemia   . Hypertension    . Liver cirrhosis (HCC)   . Peripheral vascular disease [facility])      Past Surgical History:  Procedure Laterality Date  . CHOLECYSTECTOMY    . COLONOSCOPY    . COLONOSCOPY WITH PROPOFOL N/A 11/22/2017   Procedure: COLONOSCOPY WITH PROPOFOL;  Surgeon: Christena Deem, MD;  Location: [facility] ENDOSCOPY;  Service: Endoscopy;  Laterality: N/A;  . ESOPHAGOGASTRODUODENOSCOPY N/A 07/31/2018   Procedure: ESOPHAGOGASTRODUODENOSCOPY (EGD);  Surgeon: Christena Deem, MD;  Location: [facility] ENDOSCOPY;  Service: Endoscopy;  Laterality: N/A;  . ESOPHAGOGASTRODUODENOSCOPY (EGD) WITH PROPOFOL N/A 02/09/2019   Procedure: ESOPHAGOGASTRODUODENOSCOPY (EGD) WITH PROPOFOL;  Surgeon: Christena Deem, MD;  Location: [facility] ENDOSCOPY;  Service: Endoscopy;  Laterality: N/A;  . MOUTH SURGERY  03/2017  . ROTATOR CUFF REPAIR     bilateral    Social History   Socioeconomic History  . Marital status: Married    Spouse name: Not on file  . Number of children: Not on file  . Years of education: Not on file  . Highest education level: Not on file  Occupational History  . Not on file  Social Needs  . Financial resource strain: Not on file  . Food insecurity    Worry: Not on file    Inability: Not on file  . Transportation needs    Medical: Not on file    Non-medical: Not on file  Tobacco Use  . Smoking status: Never Smoker  . Smokeless tobacco: Never Used  Substance and Sexual Activity  . Alcohol use: No  . Drug use: No  . Sexual activity: Not on file  Lifestyle  . Physical activity    Days per week: Not on file    Minutes per session: Not on file  . Stress: Not on file  Relationships  . Social Musician on phone: Not on file    Gets together: Not on file    Attends religious service: Not on file    Active member of club or organization: Not on file    Attends meetings of clubs or organizations: Not on file    Relationship status: Not on file  . Intimate partner violence    Fear of  current or ex partner: Not on file    Emotionally abused: Not on file    Physically abused: Not on file    Forced sexual activity: Not on file  Other Topics Concern  . Not on file  Social History Narrative  . Not on file    No family history on file.   Current Outpatient Medications:  .  aspirin EC 81 MG tablet, Take 81 mg by mouth daily., Disp: , Rfl:  .  atorvastatin (LIPITOR) 20 MG tablet, Take 20 mg by mouth daily., Disp: , Rfl:  .  Dulaglutide (TRULICITY) 1.5 MG/0.5ML SOPN, Inject 1.5 mg into the skin once a week., Disp: , Rfl:  .  glipiZIDE (GLUCOTROL XL) 10 MG 24 hr tablet, Take 1 tablet (10 mg total) by mouth daily with breakfast., Disp: 30 tablet, Rfl: 2 .  ibuprofen (ADVIL,MOTRIN) 200 MG tablet, Take 200 mg by mouth every 6 (six) hours as needed., Disp: , Rfl:  .  Insulin Degludec (TRESIBA FLEXTOUCH) 200 UNIT/ML SOPN, Inject 38 Units into the skin every  evening. , Disp: , Rfl:  .  lisinopril (PRINIVIL,ZESTRIL) 20 MG tablet, Take 20 mg by mouth daily., Disp: , Rfl:  .  metFORMIN (GLUMETZA) 500 MG (MOD) 24 hr tablet, Take 2,000 mg by mouth daily with lunch., Disp: , Rfl:  .  pantoprazole (PROTONIX) 40 MG tablet, Take 40 mg by mouth daily., Disp: , Rfl:  .  vitamin B-12 (CYANOCOBALAMIN) 1000 MCG tablet, Take 1,000 mcg by mouth daily., Disp: , Rfl:   Physical exam:  Vitals:   09/07/19 0903  BP: (!) 142/92  Pulse: 66  Resp: 16  Temp: (!) 95.8 F (35.4 C)  TempSrc: Tympanic  Weight: 187 lb 4.8 oz (85 kg)   Physical Exam HENT:     Head: Normocephalic and atraumatic.  Eyes:     Pupils: Pupils are equal, round, and reactive to light.  Neck:     Musculoskeletal: Normal range of motion.  Cardiovascular:     Rate and Rhythm: Normal rate and regular rhythm.     Heart sounds: Normal heart sounds.  Pulmonary:     Effort: Pulmonary effort is normal.     Breath sounds: Normal breath sounds.  Abdominal:     General: Bowel sounds are normal.     Palpations: Abdomen is soft.   Skin:    General: Skin is warm and dry.  Neurological:     Mental Status: He is alert and oriented to person, place, and time.      CMP Latest Ref Rng & Units 05/05/2019  Glucose 70 - 99 mg/dL 161(W224(H)  BUN 8 - 23 mg/dL 19  Creatinine 9.600.61 - 4.541.24 mg/dL 0.980.91  Sodium 119135 - 147145 mmol/L 138  Potassium 3.5 - 5.1 mmol/L 4.6  Chloride 98 - 111 mmol/L 104  CO2 22 - 32 mmol/L 25  Calcium 8.9 - 10.3 mg/dL 9.7  Total Protein 6.5 - 8.1 g/dL 7.1  Total Bilirubin 0.3 - 1.2 mg/dL 1.0  Alkaline Phos 38 - 126 U/L 119  AST 15 - 41 U/L 37  ALT 0 - 44 U/L 38   CBC Latest Ref Rng & Units 09/07/2019  WBC 4.0 - 10.5 K/uL 5.6  Hemoglobin 13.0 - 17.0 g/dL 82.914.4  Hematocrit 56.239.0 - 52.0 % 40.5  Platelets 150 - 400 K/uL 114(L)      Assessment and plan- Patient is a 68 y.o. 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  Last ferritin in July 2020 was 38.  Goal ferritin is to keep it less than 100.  He will likely not need phlebotomy today.  Repeat CBC, ferritin and CMP in 3 months in 6 months and I will see him back in 6 months.  Mild thrombocytopenia.  Stable continue to monitor   Visit Diagnosis 1. Iron overload syndrome      Dr. Owens SharkArchana Rao, MD, MPH [facility] [phone] 09/07/2019 9:03 AM

## 2019-09-07 NOTE — Progress Notes (Signed)
No concerns per pt

## 2019-09-08 ENCOUNTER — Inpatient Hospital Stay: Payer: Medicare Other

## 2019-12-07 ENCOUNTER — Other Ambulatory Visit: Payer: Self-pay

## 2019-12-07 ENCOUNTER — Inpatient Hospital Stay: Payer: Medicare Other | Attending: Oncology

## 2019-12-07 LAB — CBC WITH DIFFERENTIAL/PLATELET
Abs Immature Granulocytes: 0.02 10*3/uL (ref 0.00–0.07)
Basophils Absolute: 0 10*3/uL (ref 0.0–0.1)
Basophils Relative: 1 %
Eosinophils Absolute: 0.2 10*3/uL (ref 0.0–0.5)
Eosinophils Relative: 4 %
HCT: 43.3 % (ref 39.0–52.0)
Hemoglobin: 15.2 g/dL (ref 13.0–17.0)
Immature Granulocytes: 0 %
Lymphocytes Relative: 28 %
Lymphs Abs: 1.5 10*3/uL (ref 0.7–4.0)
MCH: 31.5 pg (ref 26.0–34.0)
MCHC: 35.1 g/dL (ref 30.0–36.0)
MCV: 89.6 fL (ref 80.0–100.0)
Monocytes Absolute: 0.4 10*3/uL (ref 0.1–1.0)
Monocytes Relative: 7 %
Neutro Abs: 3.1 10*3/uL (ref 1.7–7.7)
Neutrophils Relative %: 60 %
Platelets: 128 10*3/uL — ABNORMAL LOW (ref 150–400)
RBC: 4.83 MIL/uL (ref 4.22–5.81)
RDW: 11.9 % (ref 11.5–15.5)
WBC: 5.2 10*3/uL (ref 4.0–10.5)
nRBC: 0 % (ref 0.0–0.2)

## 2019-12-07 LAB — COMPREHENSIVE METABOLIC PANEL
ALT: 50 U/L — ABNORMAL HIGH (ref 0–44)
AST: 53 U/L — ABNORMAL HIGH (ref 15–41)
Albumin: 4.5 g/dL (ref 3.5–5.0)
Alkaline Phosphatase: 106 U/L (ref 38–126)
Anion gap: 7 (ref 5–15)
BUN: 17 mg/dL (ref 8–23)
CO2: 26 mmol/L (ref 22–32)
Calcium: 9.6 mg/dL (ref 8.9–10.3)
Chloride: 105 mmol/L (ref 98–111)
Creatinine, Ser: 0.75 mg/dL (ref 0.61–1.24)
GFR calc Af Amer: >60 mL/min (ref >60)
GFR calc non Af Amer: >60 mL/min (ref >60)
Glucose, Bld: 166 mg/dL — ABNORMAL HIGH (ref 70–99)
Potassium: 4 mmol/L (ref 3.5–5.1)
Sodium: 138 mmol/L (ref 135–145)
Total Bilirubin: 1 mg/dL (ref 0.3–1.2)
Total Protein: 7.2 g/dL (ref 6.5–8.1)

## 2019-12-07 LAB — FERRITIN: Ferritin: 54 ng/mL (ref 24–336)

## 2020-03-07 ENCOUNTER — Inpatient Hospital Stay (HOSPITAL_BASED_OUTPATIENT_CLINIC_OR_DEPARTMENT_OTHER): Payer: Medicare PPO | Admitting: Oncology

## 2020-03-07 ENCOUNTER — Encounter: Payer: Self-pay | Admitting: Oncology

## 2020-03-07 ENCOUNTER — Inpatient Hospital Stay: Payer: Medicare PPO | Attending: Oncology

## 2020-03-07 DIAGNOSIS — E119 Type 2 diabetes mellitus without complications: Secondary | ICD-10-CM | POA: Diagnosis not present

## 2020-03-07 DIAGNOSIS — E785 Hyperlipidemia, unspecified: Secondary | ICD-10-CM | POA: Insufficient documentation

## 2020-03-07 DIAGNOSIS — Z7982 Long term (current) use of aspirin: Secondary | ICD-10-CM | POA: Diagnosis not present

## 2020-03-07 DIAGNOSIS — Z79899 Other long term (current) drug therapy: Secondary | ICD-10-CM | POA: Diagnosis not present

## 2020-03-07 DIAGNOSIS — Z794 Long term (current) use of insulin: Secondary | ICD-10-CM | POA: Insufficient documentation

## 2020-03-07 DIAGNOSIS — I10 Essential (primary) hypertension: Secondary | ICD-10-CM | POA: Insufficient documentation

## 2020-03-07 DIAGNOSIS — Z791 Long term (current) use of non-steroidal anti-inflammatories (NSAID): Secondary | ICD-10-CM | POA: Insufficient documentation

## 2020-03-07 LAB — COMPREHENSIVE METABOLIC PANEL
ALT: 29 U/L (ref 0–44)
AST: 32 U/L (ref 15–41)
Albumin: 4.4 g/dL (ref 3.5–5.0)
Alkaline Phosphatase: 114 U/L (ref 38–126)
Anion gap: 9 (ref 5–15)
BUN: 20 mg/dL (ref 8–23)
CO2: 24 mmol/L (ref 22–32)
Calcium: 9.6 mg/dL (ref 8.9–10.3)
Chloride: 102 mmol/L (ref 98–111)
Creatinine, Ser: 0.78 mg/dL (ref 0.61–1.24)
GFR calc Af Amer: >60 mL/min (ref >60)
GFR calc non Af Amer: >60 mL/min (ref >60)
Glucose, Bld: 162 mg/dL — ABNORMAL HIGH (ref 70–99)
Potassium: 4.2 mmol/L (ref 3.5–5.1)
Sodium: 135 mmol/L (ref 135–145)
Total Bilirubin: 1 mg/dL (ref 0.3–1.2)
Total Protein: 7.5 g/dL (ref 6.5–8.1)

## 2020-03-07 LAB — CBC WITH DIFFERENTIAL/PLATELET
Abs Immature Granulocytes: 0.01 10*3/uL (ref 0.00–0.07)
Basophils Absolute: 0 10*3/uL (ref 0.0–0.1)
Basophils Relative: 1 %
Eosinophils Absolute: 0.3 10*3/uL (ref 0.0–0.5)
Eosinophils Relative: 6 %
HCT: 42.1 % (ref 39.0–52.0)
Hemoglobin: 15.2 g/dL (ref 13.0–17.0)
Immature Granulocytes: 0 %
Lymphocytes Relative: 24 %
Lymphs Abs: 1.3 10*3/uL (ref 0.7–4.0)
MCH: 32.5 pg (ref 26.0–34.0)
MCHC: 36.1 g/dL — ABNORMAL HIGH (ref 30.0–36.0)
MCV: 90 fL (ref 80.0–100.0)
Monocytes Absolute: 0.3 10*3/uL (ref 0.1–1.0)
Monocytes Relative: 6 %
Neutro Abs: 3.6 10*3/uL (ref 1.7–7.7)
Neutrophils Relative %: 63 %
Platelets: 118 10*3/uL — ABNORMAL LOW (ref 150–400)
RBC: 4.68 MIL/uL (ref 4.22–5.81)
RDW: 12.2 % (ref 11.5–15.5)
WBC: 5.6 10*3/uL (ref 4.0–10.5)
nRBC: 0 % (ref 0.0–0.2)

## 2020-03-07 LAB — FERRITIN: Ferritin: 67 ng/mL (ref 24–336)

## 2020-03-07 NOTE — Progress Notes (Signed)
Pt doing well no concerns 

## 2020-03-09 NOTE — Progress Notes (Signed)
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 He has been referred to [place] for elevated ferritin. His recent bloodwork from 05/21/2017 was as follows CMP showed elevated blood sugar of 211, elevated AST and ALT of 54 and 87 respectively. He has been seen by [place] [facility] back in 2016 for elevated liver function tests as well but has not followed up with them since then. Most recent ferritin I have for him is from January 2017 which was elevated at 1010. In April 2016 his ferritin was 918. Hepatitis C testing in 2017 was negative. Recent TSH on February 2018 was normal. Recent CBC from July 2017 showed white count of 7.6, H&H of 16.6/46.3 with an MCV of 91.3 and a platelet count of 147.  Patient denies any alcohol use. Denies any unintentional weight loss. No family history of any blood disorders such as thalassemia. No history of any blood transfusions. 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 He also has a known hypodense lesion in his liver which was stable as compared to 2003 and compatible with a benign hemangioma. No splenomegaly or adenopathy  Results of bloodwork from 03-2017 breast follows: CBC showed white count of 8.9, H&H of 15.7/44.1 and a platelet count of 168.CMP  was significant for elevated AST and ALT of 1806, elevated blood sugar of 164 total bilirubin was normal.ferritin levels were elevated at 1807. Iron studies showed iron saturation of 34%. 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  HENT: Negative for congestion, ear discharge and nosebleeds.   Eyes: Negative for blurred vision.  Respiratory: Negative for cough, hemoptysis, sputum production, shortness of breath and wheezing.   Cardiovascular: Negative for chest pain, palpitations, orthopnea and claudication.  Gastrointestinal: Negative for abdominal pain, blood in stool, constipation, diarrhea, heartburn, melena, nausea and vomiting.  Genitourinary: Negative for dysuria, flank pain, frequency, hematuria and urgency.  Musculoskeletal: Negative for back pain, joint pain and myalgias.  Skin: Negative for rash.  Neurological: Negative for dizziness, tingling, focal weakness, seizures, weakness and headaches.  Endo/Heme/Allergies: Does not bruise/bleed easily.  Psychiatric/Behavioral: Negative for depression and suicidal ideas. The patient does not have insomnia.       Allergies  Allergen Reactions  . Bee Venom Swelling  . Empagliflozin Other (See Comments)    Frequent urination- jardiance     Past Medical History:  Diagnosis Date  . Abnormal LFTs   . Arthritis    osteoarthritis  . Coronary artery disease   . Diabetes mellitus without complication ([place])    type 2  . Fever  blister   . GERD (gastroesophageal reflux disease)   . Gout   . Hemochromatosis   . Hyperlipidemia   . Hypertension   . Liver cirrhosis (HCC)   . Peripheral vascular disease [facility])      Past  Surgical History:  Procedure Laterality Date  . CHOLECYSTECTOMY    . COLONOSCOPY    . COLONOSCOPY WITH PROPOFOL N/A 11/22/2017   Procedure: COLONOSCOPY WITH PROPOFOL;  Surgeon: Christena Deem, MD;  Location: [facility] ENDOSCOPY;  Service: Endoscopy;  Laterality: N/A;  . ESOPHAGOGASTRODUODENOSCOPY N/A 07/31/2018   Procedure: ESOPHAGOGASTRODUODENOSCOPY (EGD);  Surgeon: Christena Deem, MD;  Location: [facility] ENDOSCOPY;  Service: Endoscopy;  Laterality: N/A;  . ESOPHAGOGASTRODUODENOSCOPY (EGD) WITH PROPOFOL N/A 02/09/2019   Procedure: ESOPHAGOGASTRODUODENOSCOPY (EGD) WITH PROPOFOL;  Surgeon: Christena Deem, MD;  Location: [facility] ENDOSCOPY;  Service: Endoscopy;  Laterality: N/A;  . MOUTH SURGERY  03/2017  . ROTATOR CUFF REPAIR     bilateral    Social History   Socioeconomic History  . Marital status: Married    Spouse name: Not on file  . Number of children: Not on file  . Years of education: Not on file  . Highest education level: Not on file  Occupational History  . Not on file  Tobacco Use  . Smoking status: Never Smoker  . Smokeless tobacco: Never Used  Substance and Sexual Activity  . Alcohol use: No  . Drug use: No  . Sexual activity: Not on file  Other Topics Concern  . Not on file  Social History Narrative  . Not on file   Social Determinants of Health   Financial Resource Strain:   . Difficulty of Paying Living Expenses:   Food Insecurity:   . Worried About Programme researcher, broadcasting/film/video in the Last Year:   . Barista in the Last Year:   Transportation Needs:   . Freight forwarder (Medical):   Marland Kitchen Lack of Transportation (Non-Medical):   Physical Activity:   . Days of Exercise per Week:   . Minutes of Exercise per Session:   Stress:   . Feeling of Stress :   Social Connections:   . Frequency of Communication with Friends and Family:   . Frequency of Social Gatherings with Friends and Family:   . Attends Religious Services:   . Active Member of Clubs or  Organizations:   . Attends Banker Meetings:   Marland Kitchen Marital Status:   Intimate Partner Violence:   . Fear of Current or Ex-Partner:   . Emotionally Abused:   Marland Kitchen Physically Abused:   . Sexually Abused:     History reviewed. No pertinent family history.   Current Outpatient Medications:  .  aspirin EC 81 MG tablet, Take 81 mg by mouth daily., Disp: , Rfl:  .  atorvastatin (LIPITOR) 20 MG tablet, Take 20 mg by mouth daily., Disp: , Rfl:  .  Dulaglutide (TRULICITY) 3 MG/0.5ML SOPN, Inject into the skin., Disp: , Rfl:  .  ibuprofen (ADVIL,MOTRIN) 200 MG tablet, Take 200 mg by mouth every 6 (six) hours as needed., Disp: , Rfl:  .  Insulin Degludec (TRESIBA FLEXTOUCH) 200 UNIT/ML SOPN, Inject 40 Units into the skin every evening. , Disp: , Rfl:  .  lisinopril (PRINIVIL,ZESTRIL) 20 MG tablet, Take 20 mg by mouth daily., Disp: , Rfl:  .  metFORMIN (GLUMETZA) 500 MG (MOD) 24 hr tablet, Take 2,000 mg by mouth daily with lunch., Disp: , Rfl:  .  pantoprazole (PROTONIX)  40 MG tablet, Take 40 mg by mouth daily., Disp: , Rfl:  .  vitamin B-12 (CYANOCOBALAMIN) 1000 MCG tablet, Take 1,000 mcg by mouth daily., Disp: , Rfl:  .  glipiZIDE (GLUCOTROL XL) 10 MG 24 hr tablet, Take 1 tablet (10 mg total) by mouth daily with breakfast., Disp: 30 tablet, Rfl: 2  Physical exam:  Vitals:   03/07/20 0923  BP: 128/80  Pulse: 73  Resp: 18  Temp: (!) 97.1 F (36.2 C)  TempSrc: Tympanic  Weight: 189 lb 11.2 oz (86 kg)   Physical Exam Cardiovascular:     Rate and Rhythm: Normal rate and regular rhythm.     Heart sounds: Normal heart sounds.  Pulmonary:     Effort: Pulmonary effort is normal.  Skin:    General: Skin is warm and dry.  Neurological:     Mental Status: He is alert and oriented to person, place, and time.      CMP Latest Ref Rng & Units 03/07/2020  Glucose 70 - 99 mg/dL 595(G)  BUN 8 - 23 mg/dL 20  Creatinine 3.87 - 5.64 mg/dL 3.32  Sodium 951 - 884 mmol/L 135  Potassium 3.5 -  5.1 mmol/L 4.2  Chloride 98 - 111 mmol/L 102  CO2 22 - 32 mmol/L 24  Calcium 8.9 - 10.3 mg/dL 9.6  Total Protein 6.5 - 8.1 g/dL 7.5  Total Bilirubin 0.3 - 1.2 mg/dL 1.0  Alkaline Phos 38 - 126 U/L 114  AST 15 - 41 U/L 32  ALT 0 - 44 U/L 29   CBC Latest Ref Rng & Units 03/07/2020  WBC 4.0 - 10.5 K/uL 5.6  Hemoglobin 13.0 - 17.0 g/dL 16.6  Hematocrit 06.3 - 52.0 % 42.1  Platelets 150 - 400 K/uL 118(L)      Assessment and plan- Patient is a 69 y.o. 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 He is here for routine f/u for possible phlebotomy  Patient had a ferritin level of 1807 when he started phlebotomy back in 2018. He also had abnormal LFTs back then. With periodic phlebotomy it now remains <100. He has not required a phlebotomy in over a year.   Will check cbc cmp and ferritin in 6 months and 1 year. I will see him back in 1 year   Visit Diagnosis 1. Iron overload syndrome      Dr. Owens Shark, MD, MPH [facility] [phone] 03/09/2020 11:06 AM

## 2020-08-26 ENCOUNTER — Encounter: Payer: Self-pay | Admitting: Emergency Medicine

## 2020-08-26 ENCOUNTER — Emergency Department: Payer: Medicare PPO

## 2020-08-26 ENCOUNTER — Other Ambulatory Visit: Payer: Self-pay

## 2020-08-26 DIAGNOSIS — W19XXXA Unspecified fall, initial encounter: Secondary | ICD-10-CM | POA: Diagnosis not present

## 2020-08-26 DIAGNOSIS — M7989 Other specified soft tissue disorders: Secondary | ICD-10-CM | POA: Insufficient documentation

## 2020-08-26 DIAGNOSIS — Z5321 Procedure and treatment not carried out due to patient leaving prior to being seen by health care provider: Secondary | ICD-10-CM | POA: Insufficient documentation

## 2020-08-26 NOTE — ED Triage Notes (Signed)
Pt reports he fell about a week ago and scraped left leg reports today he noticed swelling to left leg. Pt deniss any pain at present, denies pain when walking. Palpable pedakl pulse.

## 2020-08-27 ENCOUNTER — Emergency Department
Admission: EM | Admit: 2020-08-27 | Discharge: 2020-08-27 | Disposition: A | Discharge status: Home / Self Care | Payer: Medicare PPO | Attending: Emergency Medicine | Admitting: Emergency Medicine

## 2020-08-27 NOTE — ED Notes (Signed)
Patient up to stat desk to report he's leaving. Patient encouraged to stay. Patient reports he intends to leave.

## 2020-09-07 ENCOUNTER — Inpatient Hospital Stay: Payer: Medicare PPO | Attending: Oncology

## 2020-09-07 ENCOUNTER — Other Ambulatory Visit: Payer: Self-pay

## 2020-09-07 DIAGNOSIS — D509 Iron deficiency anemia, unspecified: Secondary | ICD-10-CM | POA: Diagnosis not present

## 2020-09-07 LAB — COMPREHENSIVE METABOLIC PANEL
ALT: 24 U/L (ref 0–44)
AST: 23 U/L (ref 15–41)
Albumin: 4.5 g/dL (ref 3.5–5.0)
Alkaline Phosphatase: 91 U/L (ref 38–126)
Anion gap: 11 (ref 5–15)
BUN: 20 mg/dL (ref 8–23)
CO2: 25 mmol/L (ref 22–32)
Calcium: 9 mg/dL (ref 8.9–10.3)
Chloride: 103 mmol/L (ref 98–111)
Creatinine, Ser: 0.91 mg/dL (ref 0.61–1.24)
GFR calc Af Amer: >60 mL/min (ref >60)
GFR calc non Af Amer: >60 mL/min (ref >60)
Glucose, Bld: 248 mg/dL — ABNORMAL HIGH (ref 70–99)
Potassium: 4.2 mmol/L (ref 3.5–5.1)
Sodium: 139 mmol/L (ref 135–145)
Total Bilirubin: 1.1 mg/dL (ref 0.3–1.2)
Total Protein: 7.1 g/dL (ref 6.5–8.1)

## 2020-09-07 LAB — CBC WITH DIFFERENTIAL/PLATELET
Abs Immature Granulocytes: 0.01 10*3/uL (ref 0.00–0.07)
Basophils Absolute: 0 10*3/uL (ref 0.0–0.1)
Basophils Relative: 0 %
Eosinophils Absolute: 0.2 10*3/uL (ref 0.0–0.5)
Eosinophils Relative: 3 %
HCT: 39.8 % (ref 39.0–52.0)
Hemoglobin: 14.7 g/dL (ref 13.0–17.0)
Immature Granulocytes: 0 %
Lymphocytes Relative: 25 %
Lymphs Abs: 1.4 10*3/uL (ref 0.7–4.0)
MCH: 32.7 pg (ref 26.0–34.0)
MCHC: 36.9 g/dL — ABNORMAL HIGH (ref 30.0–36.0)
MCV: 88.6 fL (ref 80.0–100.0)
Monocytes Absolute: 0.4 10*3/uL (ref 0.1–1.0)
Monocytes Relative: 7 %
Neutro Abs: 3.6 10*3/uL (ref 1.7–7.7)
Neutrophils Relative %: 65 %
Platelets: 132 10*3/uL — ABNORMAL LOW (ref 150–400)
RBC: 4.49 MIL/uL (ref 4.22–5.81)
RDW: 11.9 % (ref 11.5–15.5)
WBC: 5.6 10*3/uL (ref 4.0–10.5)
nRBC: 0 % (ref 0.0–0.2)

## 2020-09-07 LAB — FERRITIN: Ferritin: 52 ng/mL (ref 24–336)

## 2021-01-06 IMAGING — US US EXTREM LOW VENOUS*L*
1 series · 14 of 24 positions shown · non-contrast
Comparison: None.

CLINICAL DATA: Left leg swelling x1 week.

EXAM:
LEFT LOWER EXTREMITY VENOUS DOPPLER ULTRASOUND
TECHNIQUE: Gray-scale sonography with compression, as well as color and duplex
ultrasound, were performed to evaluate the deep venous system(s)
from the level of the common femoral vein through the popliteal and
proximal calf veins.

[Series 1: us venous img lower uni left (dvt) · portal-venous · 14 of 34 slices shown]
[im 1/34]
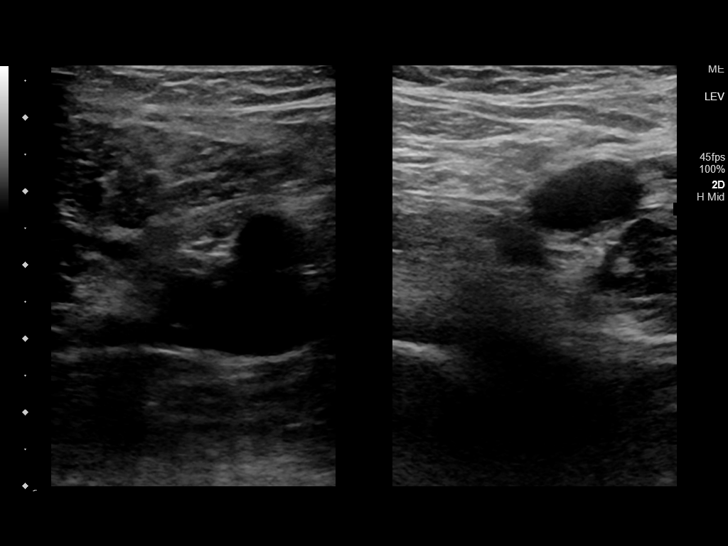
[im 3/34]
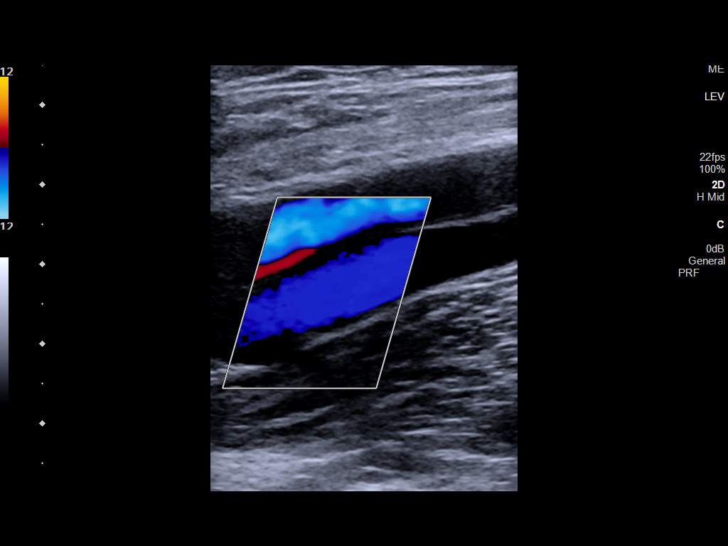
[im 6/34]
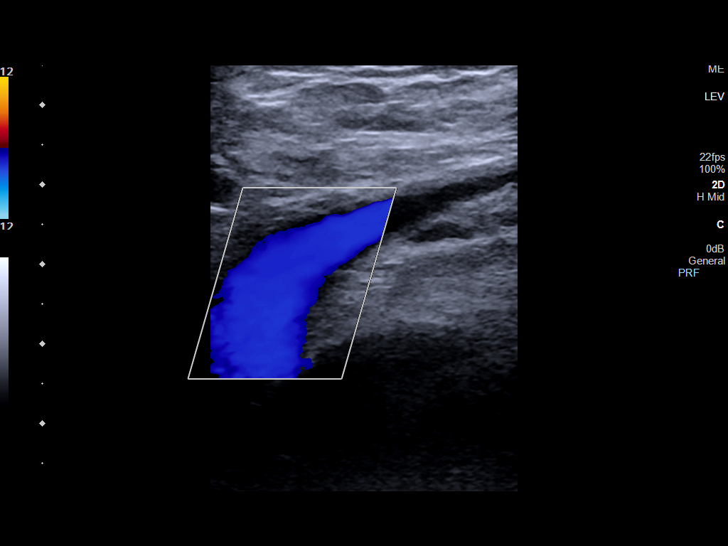
[im 9/34]
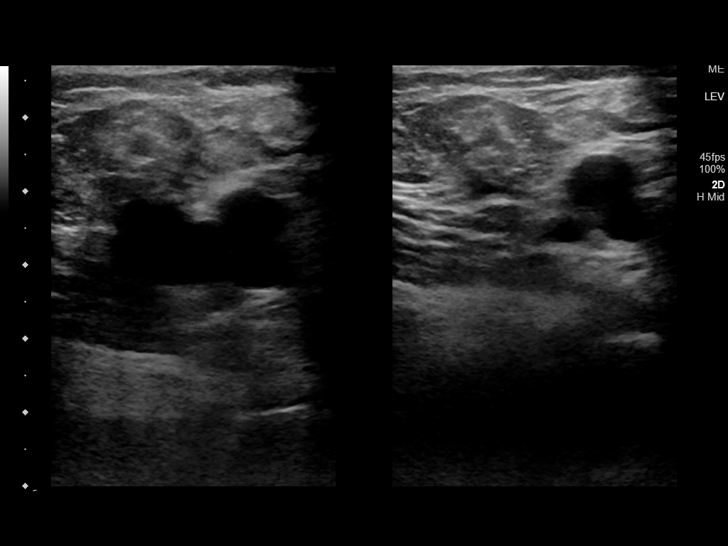
[im 11/34]
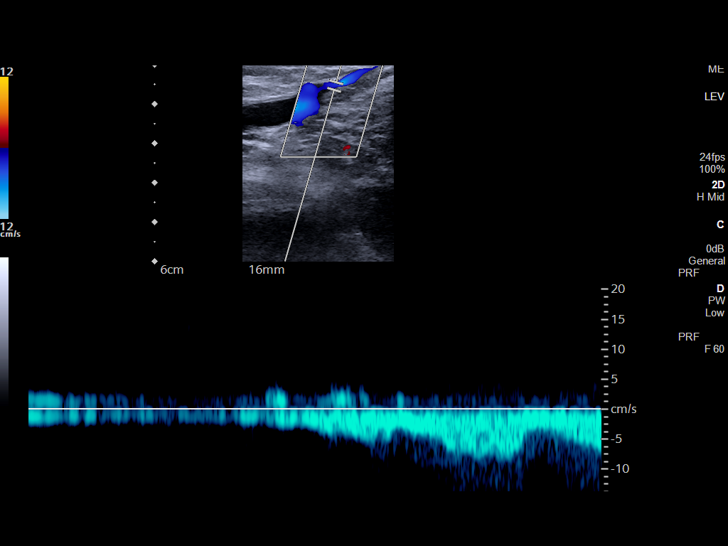
[im 13/34]
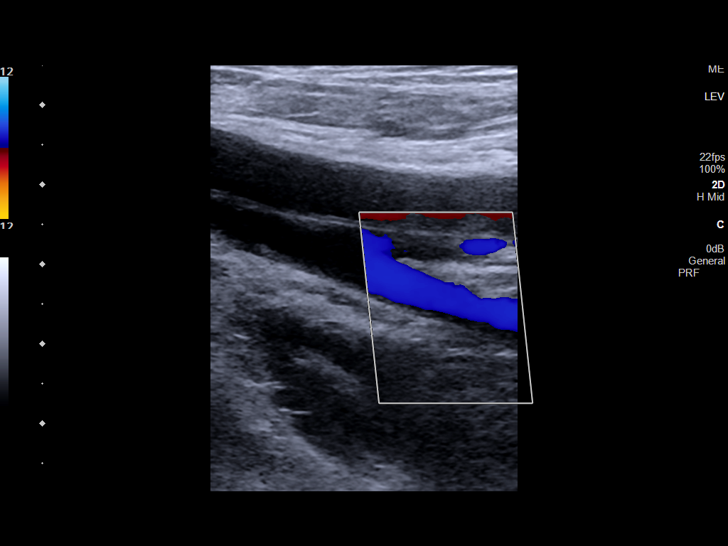
[im 16/34]
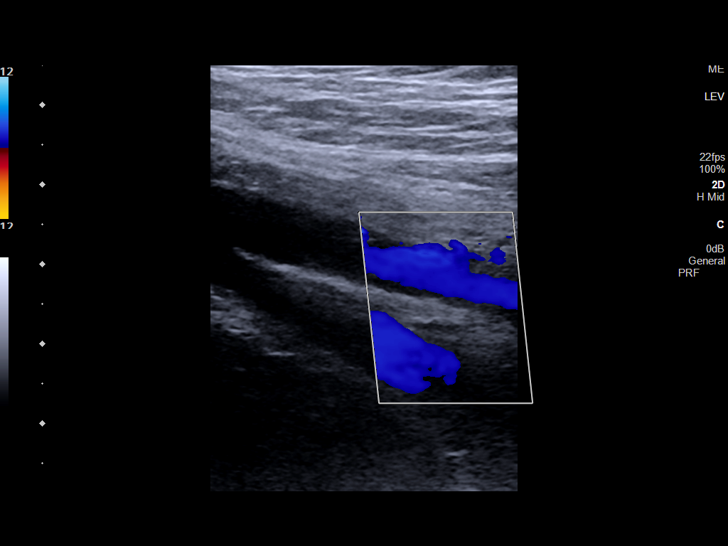
[im 18/34]
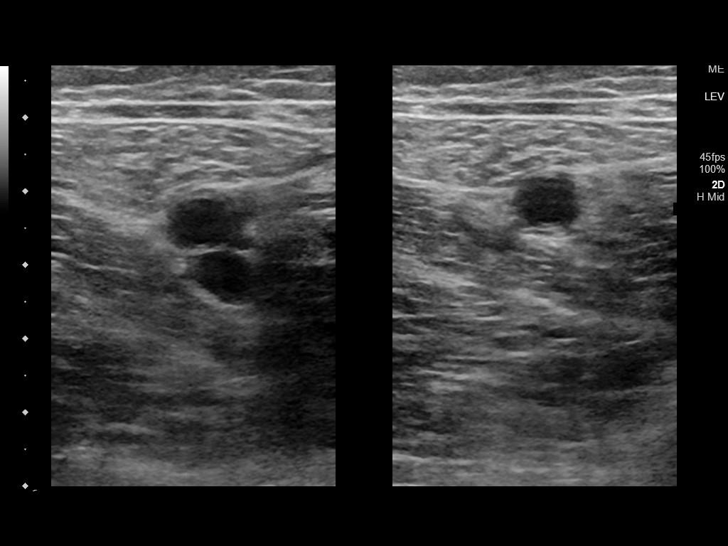
[im 21/34]
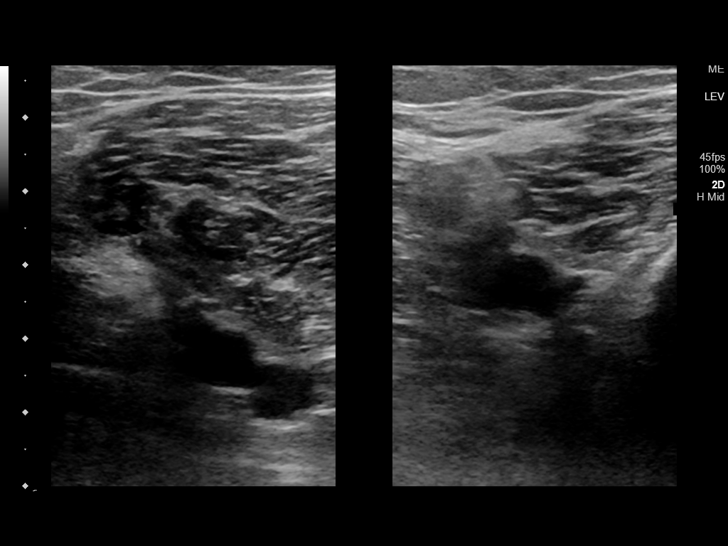
[im 23/34]
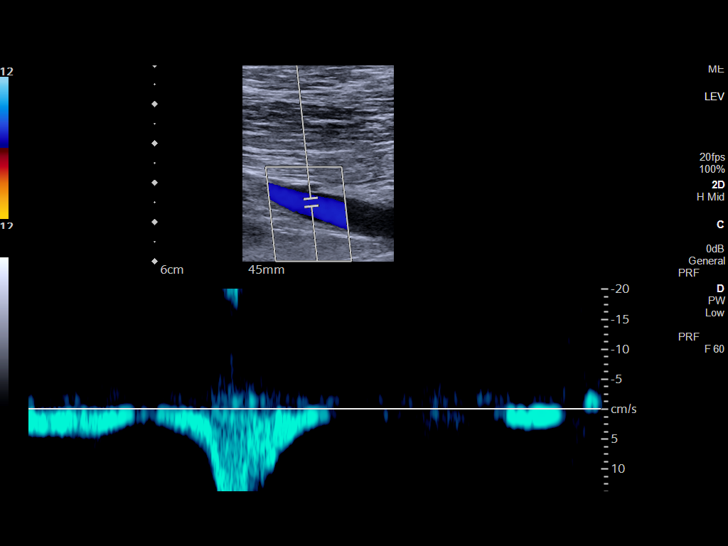
[im 26/34]
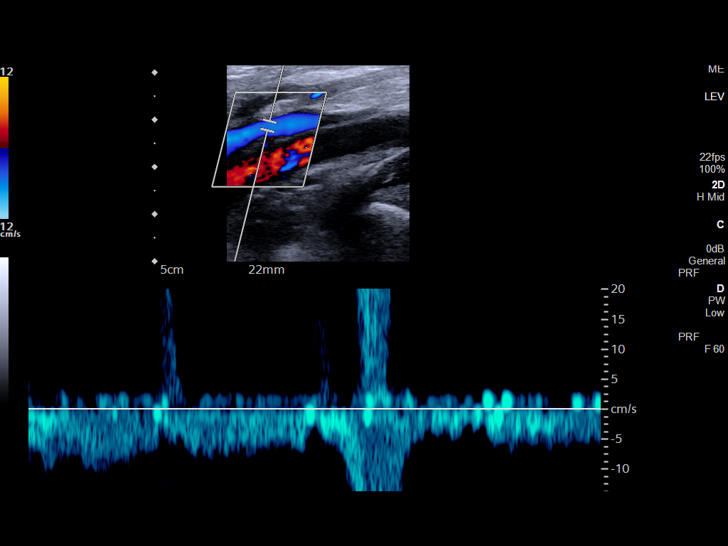
[im 28/34]
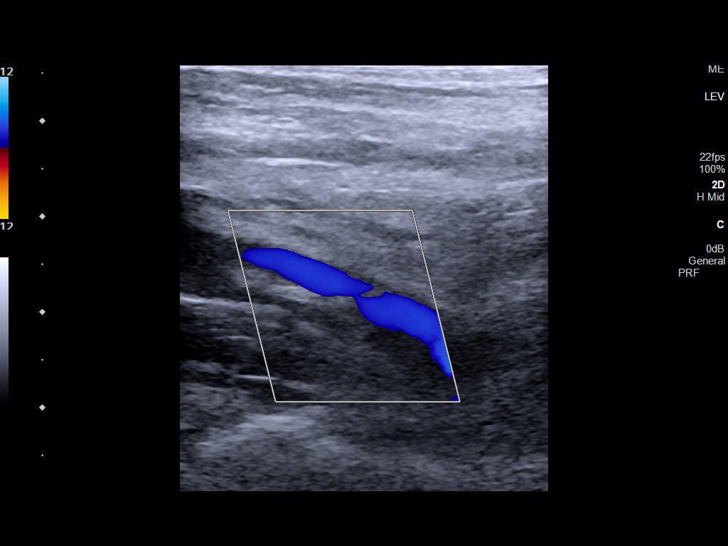
[im 31/34]
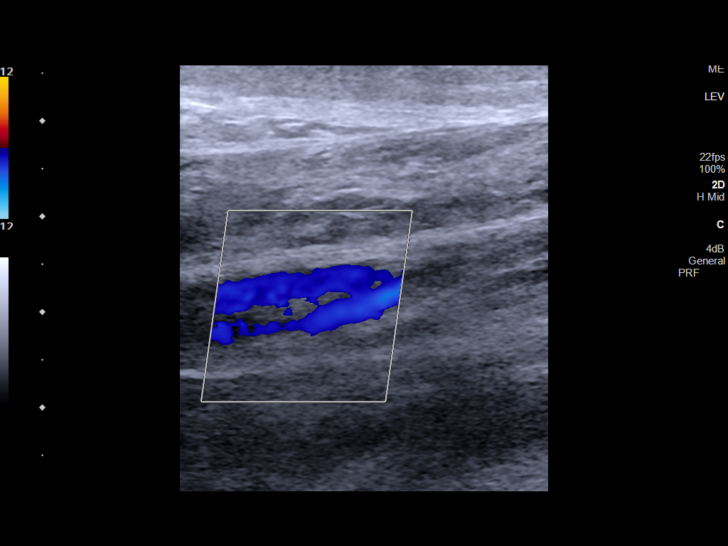
[im 34/34]
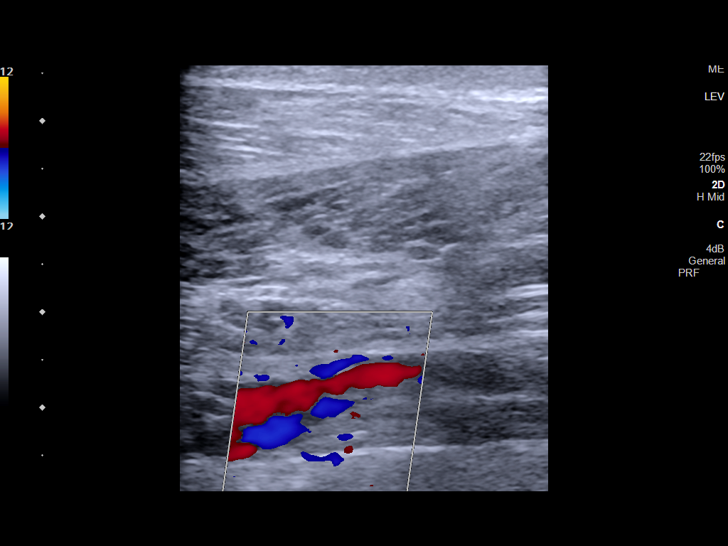

[14 of 24 positions shown; findings below may reference images not displayed]

FINDINGS: VENOUS

Normal compressibility of the common femoral, superficial femoral,
and popliteal veins, as well as the visualized calf veins.
Visualized portions of profunda femoral vein and great saphenous
vein unremarkable. No filling defects to suggest DVT on grayscale or
color Doppler imaging. Doppler waveforms show normal direction of
venous flow, normal respiratory plasticity and response to
augmentation.

Limited views of the contralateral common femoral vein are
unremarkable.

OTHER

None.

Limitations: none
IMPRESSION: Negative.

## 2021-03-07 ENCOUNTER — Inpatient Hospital Stay (HOSPITAL_BASED_OUTPATIENT_CLINIC_OR_DEPARTMENT_OTHER): Payer: Medicare PPO | Admitting: Oncology

## 2021-03-07 ENCOUNTER — Inpatient Hospital Stay: Payer: Medicare PPO | Attending: Oncology

## 2021-03-07 DIAGNOSIS — Z79899 Other long term (current) drug therapy: Secondary | ICD-10-CM | POA: Diagnosis not present

## 2021-03-07 DIAGNOSIS — Z794 Long term (current) use of insulin: Secondary | ICD-10-CM | POA: Diagnosis not present

## 2021-03-07 DIAGNOSIS — I10 Essential (primary) hypertension: Secondary | ICD-10-CM | POA: Diagnosis not present

## 2021-03-07 DIAGNOSIS — E119 Type 2 diabetes mellitus without complications: Secondary | ICD-10-CM | POA: Diagnosis not present

## 2021-03-07 LAB — CBC WITH DIFFERENTIAL/PLATELET
Abs Immature Granulocytes: 0.02 10*3/uL (ref 0.00–0.07)
Basophils Absolute: 0 10*3/uL (ref 0.0–0.1)
Basophils Relative: 1 %
Eosinophils Absolute: 0.2 10*3/uL (ref 0.0–0.5)
Eosinophils Relative: 4 %
HCT: 43.7 % (ref 39.0–52.0)
Hemoglobin: 15.7 g/dL (ref 13.0–17.0)
Immature Granulocytes: 0 %
Lymphocytes Relative: 24 %
Lymphs Abs: 1.4 10*3/uL (ref 0.7–4.0)
MCH: 32.5 pg (ref 26.0–34.0)
MCHC: 35.9 g/dL (ref 30.0–36.0)
MCV: 90.5 fL (ref 80.0–100.0)
Monocytes Absolute: 0.4 10*3/uL (ref 0.1–1.0)
Monocytes Relative: 7 %
Neutro Abs: 3.8 10*3/uL (ref 1.7–7.7)
Neutrophils Relative %: 64 %
Platelets: 122 10*3/uL — ABNORMAL LOW (ref 150–400)
RBC: 4.83 MIL/uL (ref 4.22–5.81)
RDW: 12.5 % (ref 11.5–15.5)
WBC: 6 10*3/uL (ref 4.0–10.5)
nRBC: 0 % (ref 0.0–0.2)

## 2021-03-07 LAB — COMPREHENSIVE METABOLIC PANEL
ALT: 32 U/L (ref 0–44)
AST: 33 U/L (ref 15–41)
Albumin: 4.6 g/dL (ref 3.5–5.0)
Alkaline Phosphatase: 107 U/L (ref 38–126)
Anion gap: 11 (ref 5–15)
BUN: 19 mg/dL (ref 8–23)
CO2: 27 mmol/L (ref 22–32)
Calcium: 9.6 mg/dL (ref 8.9–10.3)
Chloride: 102 mmol/L (ref 98–111)
Creatinine, Ser: 0.82 mg/dL (ref 0.61–1.24)
GFR, Estimated: >60 mL/min (ref >60)
Glucose, Bld: 140 mg/dL — ABNORMAL HIGH (ref 70–99)
Potassium: 4.5 mmol/L (ref 3.5–5.1)
Sodium: 140 mmol/L (ref 135–145)
Total Bilirubin: 1.1 mg/dL (ref 0.3–1.2)
Total Protein: 7.5 g/dL (ref 6.5–8.1)

## 2021-03-07 LAB — FERRITIN: Ferritin: 52 ng/mL (ref 24–336)

## 2021-03-08 NOTE — Progress Notes (Signed)
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 He has been referred to [place] for elevated ferritin. His recent bloodwork from 05/21/2017 was as follows CMP showed elevated blood sugar of 211, elevated AST and ALT of 54 and 87 respectively. He has been seen by [facility] back in 2016 for elevated liver function tests as well but has not followed up with them since then. Most recent ferritin I have for him is from January 2017 which was elevated at 1010. In April 2016 his ferritin was 918. Hepatitis C testing in 2017 was negative. Recent TSH on February 2018 was normal. Recent CBC from July 2017 showed white count of 7.6, H&H of 16.6/46.3 with an MCV of 91.3 and a platelet count of 147.  Patient denies any alcohol use. Denies any unintentional weight loss. No family history of any blood disorders such as thalassemia. No history of any blood transfusions. 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 He also has a known hypodense lesion in his liver which was stable as compared to 2003 and compatible with a benign hemangioma. No splenomegaly or adenopathy  Results of bloodwork from 03-2017 breast follows: CBC showed white count of 8.9, H&H of 15.7/44.1 and a platelet count of 168.CMP was significant for  elevated AST and ALT of 1806, elevated blood sugar of 164 total bilirubin was normal.ferritin levels were elevated at 1807. Iron studies showed iron saturation of 34%. 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  HENT: Negative for congestion, ear discharge and nosebleeds.   Eyes: Negative for blurred vision.  Respiratory: Negative for cough, hemoptysis, sputum production, shortness of breath and wheezing.   Cardiovascular: Negative for chest pain, palpitations, orthopnea and claudication.  Gastrointestinal: Negative for abdominal pain, blood in stool, constipation, diarrhea, heartburn, melena, nausea and vomiting.  Genitourinary: Negative for dysuria, flank pain, frequency, hematuria and urgency.  Musculoskeletal: Negative for back pain, joint pain and myalgias.  Skin: Negative for rash.  Neurological: Negative for dizziness, tingling, focal weakness, seizures, weakness and headaches.  Endo/Heme/Allergies: Does not bruise/bleed easily.  Psychiatric/Behavioral: Negative for depression and suicidal ideas. The patient does not have insomnia.       Allergies  Allergen Reactions  . Bee Venom Swelling  . Empagliflozin Other (See Comments)    Frequent urination- jardiance     Past Medical History:  Diagnosis Date  . Abnormal LFTs   . Arthritis    osteoarthritis  . Coronary artery disease   . Diabetes mellitus without complication (HCC)    type 2  . Fever blister   . GERD (gastroesophageal  reflux disease)   . Gout   . Hemochromatosis   . Hyperlipidemia   . Hypertension   . Liver cirrhosis (HCC)   . Peripheral vascular disease [facility])      Past Surgical History:   Procedure Laterality Date  . CHOLECYSTECTOMY    . COLONOSCOPY    . COLONOSCOPY WITH PROPOFOL N/A 11/22/2017   Procedure: COLONOSCOPY WITH PROPOFOL;  Surgeon: Christena Deem, MD;  Location[facility] ENDOSCOPY;  Service: Endoscopy;  Laterality: N/A;  . ESOPHAGOGASTRODUODENOSCOPY N/A 07/31/2018   Procedure: ESOPHAGOGASTRODUODENOSCOPY (EGD);  Surgeon: Christena Deem, MD;  Location: [facility] ENDOSCOPY;  Service: Endoscopy;  Laterality: N/A;  . ESOPHAGOGASTRODUODENOSCOPY (EGD) WITH PROPOFOL N/A 02/09/2019   Procedure: ESOPHAGOGASTRODUODENOSCOPY (EGD) WITH PROPOFOL;  Surgeon: Christena Deem, MD;  Location: [facility] ENDOSCOPY;  Service: Endoscopy;  Laterality: N/A;  . MOUTH SURGERY  03/2017  . ROTATOR CUFF REPAIR     bilateral    Social History   Socioeconomic History  . Marital status: Married    Spouse name: Not on file  . Number of children: Not on file  . Years of education: Not on file  . Highest education level: Not on file  Occupational History  . Not on file  Tobacco Use  . Smoking status: Never Smoker  . Smokeless tobacco: Never Used  Vaping Use  . Vaping Use: Never used  Substance and Sexual Activity  . Alcohol use: No  . Drug use: No  . Sexual activity: Not on file  Other Topics Concern  . Not on file  Social History Narrative  . Not on file   Social Determinants of Health   Financial Resource Strain: Not on file  Food Insecurity: Not on file  Transportation Needs: Not on file  Physical Activity: Not on file  Stress: Not on file  Social Connections: Not on file  Intimate Partner Violence: Not on file    No family history on file.   Current Outpatient Medications:  .  atorvastatin (LIPITOR) 20 MG tablet, Take 20 mg by mouth daily., Disp: , Rfl:  .  Dulaglutide (TRULICITY) 3 MG/0.5ML SOPN, Inject into the skin., Disp: , Rfl:  .  ibuprofen (ADVIL,MOTRIN) 200 MG tablet, Take 200 mg by mouth every 6 (six) hours as needed., Disp: , Rfl:  .  insulin degludec  (TRESIBA) 200 UNIT/ML FlexTouch Pen, Inject 40 Units into the skin every evening. , Disp: , Rfl:  .  losartan (COZAAR) 50 MG tablet, Take 50 mg by mouth daily., Disp: , Rfl:  .  metFORMIN (GLUMETZA) 500 MG (MOD) 24 hr tablet, Take 2,000 mg by mouth daily with lunch., Disp: , Rfl:  .  pantoprazole (PROTONIX) 40 MG tablet, Take 40 mg by mouth daily., Disp: , Rfl:  .  vitamin B-12 (CYANOCOBALAMIN) 1000 MCG tablet, Take 1,000 mcg by mouth daily., Disp: , Rfl:   Physical exam:  Vitals:   03/07/21 1022  BP: (!) 156/106  Pulse: 71  Resp: 20  Temp: 98.3 F (36.8 C)  TempSrc: Tympanic  SpO2: 100%  Weight: 189 lb 14.4 oz (86.1 kg)   Physical Exam Constitutional:      General: He is not in acute distress. Eyes:     Pupils: Pupils are equal, round, and reactive to light.  Cardiovascular:     Rate and Rhythm: Normal rate and regular rhythm.     Heart sounds: Normal heart sounds.  Pulmonary:     Effort: Pulmonary effort is normal.     Breath sounds: Normal  breath sounds.  Skin:    General: Skin is warm and dry.  Neurological:     Mental Status: He is alert and oriented to person, place, and time.      CMP Latest Ref Rng & Units 03/07/2021  Glucose 70 - 99 mg/dL 629(U)  BUN 8 - 23 mg/dL 19  Creatinine 7.65 - 4.65 mg/dL 0.35  Sodium 465 - 681 mmol/L 140  Potassium 3.5 - 5.1 mmol/L 4.5  Chloride 98 - 111 mmol/L 102  CO2 22 - 32 mmol/L 27  Calcium 8.9 - 10.3 mg/dL 9.6  Total Protein 6.5 - 8.1 g/dL 7.5  Total Bilirubin 0.3 - 1.2 mg/dL 1.1  Alkaline Phos 38 - 126 U/L 107  AST 15 - 41 U/L 33  ALT 0 - 44 U/L 32   CBC Latest Ref Rng & Units 03/07/2021  WBC 4.0 - 10.5 K/uL 6.0  Hemoglobin 13.0 - 17.0 g/dL 27.5  Hematocrit 17.0 - 52.0 % 43.7  Platelets 150 - 400 K/uL 122(L)    Assessment and plan- Patient is a 70 y.o. 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  He has not required any phlebotomy in 2 years.  LFTs remain normal.  He does not have any underlying cirrhosis and therefore does not require any HCC surveillance.  I will see him back in 1 year with CBC ferritin and iron studies   Visit Diagnosis 1. Iron overload syndrome   2. Hereditary hemochromatosis (HCC)      Dr. Owens Shark, MD, MPH [facility] at [facility] [phone] 03/08/2021 8:26 AM

## 2022-03-05 ENCOUNTER — Telehealth: Payer: Self-pay | Admitting: *Deleted

## 2022-03-05 NOTE — Telephone Encounter (Signed)
Patient called to cnl his apt. He states that his pcp will monitor his labs at this time and he does not wish to follow-up. Patient sch. With Leotis Shames, NP on 3/30 ? ?I attempted to reach patient to return his phone call. No answer. I left a brief vm. that his apts were cnl per his request.  ?

## 2022-03-08 ENCOUNTER — Other Ambulatory Visit: Payer: Medicare PPO

## 2022-03-08 ENCOUNTER — Ambulatory Visit: Payer: Medicare PPO | Admitting: Nurse Practitioner

## 2023-07-31 ENCOUNTER — Other Ambulatory Visit: Payer: Self-pay | Admitting: Gastroenterology

## 2023-07-31 DIAGNOSIS — K7460 Unspecified cirrhosis of liver: Secondary | ICD-10-CM

## 2023-08-07 ENCOUNTER — Ambulatory Visit
Admission: RE | Admit: 2023-08-07 | Discharge: 2023-08-07 | Disposition: A | Discharge status: Home / Self Care | Payer: Medicare PPO | Source: Ambulatory Visit | Attending: Gastroenterology | Admitting: Gastroenterology

## 2023-08-07 DIAGNOSIS — K7460 Unspecified cirrhosis of liver: Secondary | ICD-10-CM

## 2023-09-25 ENCOUNTER — Ambulatory Visit: Payer: Medicare PPO | Admitting: Dermatology

## 2023-11-26 ENCOUNTER — Other Ambulatory Visit: Payer: Self-pay

## 2023-11-26 ENCOUNTER — Ambulatory Visit
Admission: RE | Admit: 2023-11-26 | Discharge: 2023-11-26 | Disposition: A | Discharge status: Home / Self Care | Payer: Medicare PPO | Attending: Gastroenterology | Admitting: Gastroenterology

## 2023-11-26 ENCOUNTER — Ambulatory Visit: Payer: Medicare PPO | Admitting: Certified Registered"

## 2023-11-26 ENCOUNTER — Encounter
Admission: RE | Disposition: A | Discharge status: Home / Self Care | Payer: Self-pay | Source: Home / Self Care | Attending: Gastroenterology

## 2023-11-26 ENCOUNTER — Encounter: Payer: Self-pay | Admitting: *Deleted

## 2023-11-26 DIAGNOSIS — I2510 Atherosclerotic heart disease of native coronary artery without angina pectoris: Secondary | ICD-10-CM | POA: Insufficient documentation

## 2023-11-26 DIAGNOSIS — K2970 Gastritis, unspecified, without bleeding: Secondary | ICD-10-CM | POA: Insufficient documentation

## 2023-11-26 DIAGNOSIS — D122 Benign neoplasm of ascending colon: Secondary | ICD-10-CM | POA: Diagnosis not present

## 2023-11-26 DIAGNOSIS — D128 Benign neoplasm of rectum: Secondary | ICD-10-CM | POA: Diagnosis not present

## 2023-11-26 DIAGNOSIS — I10 Essential (primary) hypertension: Secondary | ICD-10-CM | POA: Diagnosis not present

## 2023-11-26 DIAGNOSIS — K7460 Unspecified cirrhosis of liver: Secondary | ICD-10-CM | POA: Diagnosis not present

## 2023-11-26 DIAGNOSIS — Z7985 Long-term (current) use of injectable non-insulin antidiabetic drugs: Secondary | ICD-10-CM | POA: Diagnosis not present

## 2023-11-26 DIAGNOSIS — D120 Benign neoplasm of cecum: Secondary | ICD-10-CM | POA: Insufficient documentation

## 2023-11-26 DIAGNOSIS — E119 Type 2 diabetes mellitus without complications: Secondary | ICD-10-CM | POA: Diagnosis not present

## 2023-11-26 DIAGNOSIS — K640 First degree hemorrhoids: Secondary | ICD-10-CM | POA: Diagnosis not present

## 2023-11-26 DIAGNOSIS — Z7984 Long term (current) use of oral hypoglycemic drugs: Secondary | ICD-10-CM | POA: Diagnosis not present

## 2023-11-26 DIAGNOSIS — Z794 Long term (current) use of insulin: Secondary | ICD-10-CM | POA: Insufficient documentation

## 2023-11-26 DIAGNOSIS — K219 Gastro-esophageal reflux disease without esophagitis: Secondary | ICD-10-CM | POA: Diagnosis not present

## 2023-11-26 DIAGNOSIS — D123 Benign neoplasm of transverse colon: Secondary | ICD-10-CM | POA: Insufficient documentation

## 2023-11-26 DIAGNOSIS — Z1211 Encounter for screening for malignant neoplasm of colon: Secondary | ICD-10-CM | POA: Insufficient documentation

## 2023-11-26 HISTORY — PX: COLONOSCOPY WITH PROPOFOL: SHX5780

## 2023-11-26 HISTORY — PX: POLYPECTOMY: SHX5525

## 2023-11-26 HISTORY — PX: ESOPHAGOGASTRODUODENOSCOPY (EGD) WITH PROPOFOL: SHX5813

## 2023-11-26 HISTORY — PX: BIOPSY: SHX5522

## 2023-11-26 LAB — GLUCOSE, CAPILLARY
Glucose-Capillary: 61 mg/dL — ABNORMAL LOW (ref 70–99)
Glucose-Capillary: 108 mg/dL — ABNORMAL HIGH (ref 70–99)

## 2023-11-26 SURGERY — COLONOSCOPY WITH PROPOFOL
Anesthesia: General

## 2023-11-26 MED ORDER — DEXTROSE 50 % IV SOLN
INTRAVENOUS | Status: AC
  Filled 2023-11-26: qty 50

## 2023-11-26 MED ORDER — PROPOFOL 500 MG/50ML IV EMUL
INTRAVENOUS | Status: DC | PRN
  Administered 2023-11-26: 165 ug/kg/min via INTRAVENOUS

## 2023-11-26 MED ORDER — LIDOCAINE HCL (CARDIAC) PF 100 MG/5ML IV SOSY
PREFILLED_SYRINGE | INTRAVENOUS | Status: DC | PRN
  Administered 2023-11-26: 100 mg via INTRAVENOUS

## 2023-11-26 MED ORDER — DEXTROSE 50 % IV SOLN
25.0000 mL | Freq: Once | INTRAVENOUS | Status: AC
  Administered 2023-11-26: 25 mL via INTRAVENOUS

## 2023-11-26 MED ORDER — PROPOFOL 10 MG/ML IV BOLUS
INTRAVENOUS | Status: DC | PRN
  Administered 2023-11-26: 20 mg via INTRAVENOUS
  Administered 2023-11-26: 70 mg via INTRAVENOUS

## 2023-11-26 MED ORDER — GLYCOPYRROLATE 0.2 MG/ML IJ SOLN
INTRAMUSCULAR | Status: DC | PRN
  Administered 2023-11-26: .2 mg via INTRAVENOUS

## 2023-11-26 MED ORDER — SODIUM CHLORIDE 0.9 % IV SOLN: INTRAVENOUS | Status: DC

## 2023-11-26 NOTE — Op Note (Signed)
[facility] Patient Name: Gregory Cruz Procedure Date: 11/26/2023 11:12 AM MRN: [id number] Account #: [id number] Date of Birth: December 04, 1951 Admit Type: Outpatient Age: 72 Room: [facility] ENDO ROOM 1 Gender: Male Note Status: Finalized Instrument Name: Prentice Docker [id number] Procedure:             Colonoscopy Indications:           Surveillance: Personal history of colonic polyps                         (unknown histology) on last colonoscopy 5 years ago Providers:             Eather Colas MD, MD Referring MD:          Eather Colas MD, MD (Referring MD), Marya Amsler.                         Dareen Piano MD, MD (Referring MD) Medicines:             Monitored Anesthesia Care Complications:         No immediate complications. Estimated blood loss:                         Minimal. Procedure:             Pre-Anesthesia Assessment:                        - Prior to the procedure, a History and Physical was                         performed, and patient medications and allergies were                         reviewed. The patient is competent. The risks and                         benefits of the procedure and the sedation options and                         risks were discussed with the patient. All questions                         were answered and informed consent was obtained.                         Patient identification and proposed procedure were                         verified by the physician, the nurse, the                         anesthesiologist, the anesthetist and the technician                         in the endoscopy suite. Mental Status Examination:                         alert and oriented. Airway Examination: normal  oropharyngeal airway and neck mobility. Respiratory                         Examination: clear to auscultation. CV Examination:                         normal. Prophylactic Antibiotics: The patient  does not                         require prophylactic antibiotics. Prior                         Anticoagulants: The patient has taken no anticoagulant                         or antiplatelet agents. ASA Grade Assessment: III - A                         patient with severe systemic disease. After reviewing                         the risks and benefits, the patient was deemed in                         satisfactory condition to undergo the procedure. The                         anesthesia plan was to use monitored anesthesia care                         (MAC). Immediately prior to administration of                         medications, the patient was re-assessed for adequacy                         to receive sedatives. The heart rate, respiratory                         rate, oxygen saturations, blood pressure, adequacy of                         pulmonary ventilation, and response to care were                         monitored throughout the procedure. The physical                         status of the patient was re-assessed after the                         procedure.                        After obtaining informed consent, the colonoscope was                         passed under direct vision. Throughout the procedure,  the patient's blood pressure, pulse, and oxygen                         saturations were monitored continuously. The                         Colonoscope was introduced through the anus and                         advanced to the the cecum, identified by appendiceal                         orifice and ileocecal valve. The colonoscopy was                         performed without difficulty. The patient tolerated                         the procedure well. The quality of the bowel                         preparation was adequate to identify polyps. The                         ileocecal valve, appendiceal orifice, and rectum were                          photographed. Findings:      The perianal and digital rectal examinations were normal.      A 1 mm polyp was found in the appendiceal orifice. The polyp was       sessile. The polyp was removed with a jumbo cold forceps. Resection and       retrieval were complete. Estimated blood loss was minimal.      A 2 mm polyp was found in the ascending colon. The polyp was sessile.       The polyp was removed with a cold snare. Resection and retrieval were       complete. Estimated blood loss was minimal.      A 3 mm polyp was found in the transverse colon. The polyp was sessile.       The polyp was removed with a cold snare. Resection and retrieval were       complete. Estimated blood loss was minimal.      A 1 mm polyp was found in the rectum. The polyp was sessile. The polyp       was removed with a jumbo cold forceps. Resection and retrieval were       complete. Estimated blood loss was minimal.      Internal hemorrhoids were found during retroflexion. The hemorrhoids       were Grade I (internal hemorrhoids that do not prolapse).      The exam was otherwise without abnormality on direct and retroflexion       views. Impression:            - One 1 mm polyp at the appendiceal orifice, removed                         with a jumbo cold forceps. Resected and retrieved.                        -  One 2 mm polyp in the ascending colon, removed with                         a cold snare. Resected and retrieved.                        - One 3 mm polyp in the transverse colon, removed with                         a cold snare. Resected and retrieved.                        - One 1 mm polyp in the rectum, removed with a jumbo                         cold forceps. Resected and retrieved.                        - Internal hemorrhoids.                        - The examination was otherwise normal on direct and                         retroflexion views. Recommendation:        - Discharge patient to  home.                        - Resume previous diet.                        - Continue present medications.                        - Await pathology results.                        - Repeat colonoscopy in 3 - 5 years for surveillance                         based on pathology results.                        - Return to referring physician as previously                         scheduled. Procedure Code(s):     --- Professional ---                        [phone], Colonoscopy, flexible; with removal of                         tumor(s), polyp(s), or other lesion(s) by snare                         technique                        [id number], 59, Colonoscopy, flexible; with biopsy, single  or multiple Diagnosis Code(s):     --- Professional ---                        Z86.010, Personal history of colonic polyps                        D12.1, Benign neoplasm of appendix                        D12.2, Benign neoplasm of ascending colon                        D12.3, Benign neoplasm of transverse colon (hepatic                         flexure or splenic flexure)                        D12.8, Benign neoplasm of rectum                        K64.0, First degree hemorrhoids CPT copyright 2022 American Medical Association. All rights reserved. The codes documented in this report are preliminary and upon coder review may  be revised to meet current compliance requirements. Eather Colas MD, MD 11/26/2023 11:50:27 AM Number of Addenda: 0 Note Initiated On: 11/26/2023 11:12 AM Scope Withdrawal Time: 0 hours 10 minutes 48 seconds  Total Procedure Duration: 0 hours 14 minutes 43 seconds  Estimated Blood Loss:  Estimated blood loss was minimal.      [facility]

## 2023-11-26 NOTE — Anesthesia Preprocedure Evaluation (Signed)
Anesthesia Evaluation  Patient identified by MRN, date of birth, ID band Patient awake    Reviewed: Allergy & Precautions, H&P , NPO status , Patient's Chart, lab work & pertinent test results, reviewed documented beta blocker date and time   History of Anesthesia Complications Negative for: history of anesthetic complications  Airway Mallampati: II   Neck ROM: full    Dental  (+) Poor Dentition, Dental Advidsory Given   Pulmonary neg pulmonary ROS          Cardiovascular Exercise Tolerance: Good hypertension, On Medications (-) angina + CAD and + Peripheral Vascular Disease  (-) Past MI, (-) Cardiac Stents and (-) CABG negative cardio ROS (-) dysrhythmias (-) Valvular Problems/Murmurs     Neuro/Psych negative neurological ROS  negative psych ROS   GI/Hepatic Neg liver ROS,GERD  Controlled and Medicated,,  Endo/Other  diabetes    Renal/GU Renal diseasenegative Renal ROS  negative genitourinary   Musculoskeletal   Abdominal   Peds  Hematology negative hematology ROS (+)   Anesthesia Other Findings Past Medical History: No date: Abnormal LFTs No date: Arthritis     Comment:  osteoarthritis No date: Coronary artery disease No date: Diabetes mellitus without complication (HCC)     Comment:  type 2 No date: Fever blister No date: GERD (gastroesophageal reflux disease) No date: Gout No date: Hemochromatosis No date: Hyperlipidemia No date: Hypertension No date: Liver cirrhosis (HCC) No date: Peripheral vascular disease (HCC) Past Surgical History: No date: CHOLECYSTECTOMY No date: COLONOSCOPY 11/22/2017: COLONOSCOPY WITH PROPOFOL; N/A     Comment:  Procedure: COLONOSCOPY WITH PROPOFOL;  Surgeon:               Christena Deem, MD;  Location: [facility] ENDOSCOPY;                Service: Endoscopy;  Laterality: N/A; 03/2017: MOUTH SURGERY No date: ROTATOR CUFF REPAIR     Comment:  bilateral BMI    Body Mass  Index:  25.77 kg/m     Reproductive/Obstetrics negative OB ROS                             Anesthesia Physical Anesthesia Plan  ASA: 3  Anesthesia Plan: General   Post-op Pain Management:    Induction: Intravenous  PONV Risk Score and Plan: 2 and Propofol infusion and TIVA  Airway Management Planned: Natural Airway and Nasal Cannula  Additional Equipment:   Intra-op Plan:   Post-operative Plan:   Informed Consent: I have reviewed the patients History and Physical, chart, labs and discussed the procedure including the risks, benefits and alternatives for the proposed anesthesia with the patient or authorized representative who has indicated his/her understanding and acceptance.     Dental Advisory Given  Plan Discussed with: CRNA  Anesthesia Plan Comments:         Anesthesia Quick Evaluation

## 2023-11-26 NOTE — Anesthesia Procedure Notes (Signed)
Procedure Name: General with mask airway Date/Time: 11/26/2023 11:20 AM  Performed by: Mohammed Kindle, CRNAPre-anesthesia Checklist: Patient identified, Emergency Drugs available, Suction available and Patient being monitored Patient Re-evaluated:Patient Re-evaluated prior to induction Oxygen Delivery Method: Simple face mask Induction Type: IV induction Placement Confirmation: positive ETCO2 and breath sounds checked- equal and bilateral Dental Injury: Teeth and Oropharynx as per pre-operative assessment

## 2023-11-26 NOTE — Interval H&P Note (Signed)
History and Physical Interval Note:  11/26/2023 11:17 AM  Gregory Cruz.  has presented today for surgery, with the diagnosis of history of colon polyps,cirrhosis of liver.  The various methods of treatment have been discussed with the patient and family. After consideration of risks, benefits and other options for treatment, the patient has consented to  Procedure(s): COLONOSCOPY WITH PROPOFOL (N/A) ESOPHAGOGASTRODUODENOSCOPY (EGD) WITH PROPOFOL (N/A) as a surgical intervention.  The patient's history has been reviewed, patient examined, no change in status, stable for surgery.  I have reviewed the patient's chart and labs.  Questions were answered to the patient's satisfaction.     Regis Bill  Ok to proceed with EGD/Colonoscopy

## 2023-11-26 NOTE — Op Note (Signed)
[facility]enter[facility] Patient Name: Gregory Cruz Procedure Date: 11/26/2023 11:13 AM MRN: [id number] Account #: [id number] Date of Birth: 30-Sep-1951 Admit Type: Outpatient Age: 72 Room: [facility] ENDO ROOM 1 Gender: Male Note Status: Finalized Instrument Name: Upper Endoscope [id number] Procedure:             Upper GI endoscopy Indications:           Cirrhosis rule out esophageal varices Providers:             Eather Colas MD, MD Referring MD:          Eather Colas MD, MD (Referring MD), Marya Amsler.                         Dareen Piano MD, MD (Referring MD) Medicines:             Monitored Anesthesia Care Complications:         No immediate complications. Estimated blood loss:                         Minimal. Procedure:             Pre-Anesthesia Assessment:                        - Prior to the procedure, a History and Physical was                         performed, and patient medications and allergies were                         reviewed. The patient is competent. The risks and                         benefits of the procedure and the sedation options and                         risks were discussed with the patient. All questions                         were answered and informed consent was obtained.                         Patient identification and proposed procedure were                         verified by the physician, the nurse, the                         anesthesiologist, the anesthetist and the technician                         in the endoscopy suite. Mental Status Examination:                         alert and oriented. Airway Examination: normal                         oropharyngeal airway and neck mobility. Respiratory  Examination: clear to auscultation. CV Examination:                         normal. Prophylactic Antibiotics: The patient does not                         require prophylactic antibiotics. Prior                          Anticoagulants: The patient has taken no anticoagulant                         or antiplatelet agents. ASA Grade Assessment: III - A                         patient with severe systemic disease. After reviewing                         the risks and benefits, the patient was deemed in                         satisfactory condition to undergo the procedure. The                         anesthesia plan was to use monitored anesthesia care                         (MAC). Immediately prior to administration of                         medications, the patient was re-assessed for adequacy                         to receive sedatives. The heart rate, respiratory                         rate, oxygen saturations, blood pressure, adequacy of                         pulmonary ventilation, and response to care were                         monitored throughout the procedure. The physical                         status of the patient was re-assessed after the                         procedure.                        After obtaining informed consent, the endoscope was                         passed under direct vision. Throughout the procedure,                         the patient's blood pressure, pulse, and oxygen  saturations were monitored continuously. The Endoscope                         was introduced through the mouth, and advanced to the                         second part of duodenum. The upper GI endoscopy was                         accomplished without difficulty. The patient tolerated                         the procedure well. Findings:      There is no endoscopic evidence of varices in the entire esophagus.      Patchy mild inflammation characterized by erythema was found in the       gastric antrum. Biopsies were taken with a cold forceps for Helicobacter       pylori testing. Estimated blood loss was minimal.      The examined duodenum was  normal. Impression:            - Gastritis. Biopsied.                        - Normal examined duodenum. Recommendation:        - Discharge patient to home.                        - Resume previous diet.                        - Continue present medications.                        - Await pathology results.                        - Repeat upper endoscopy in 3 years for screening                         purposes.                        - Return to referring physician as previously                         scheduled. Procedure Code(s):     --- Professional ---                        [phone], Esophagogastroduodenoscopy, flexible,                         transoral; with biopsy, single or multiple Diagnosis Code(s):     --- Professional ---                        K29.70, Gastritis, unspecified, without bleeding                        K74.60, Unspecified cirrhosis of liver CPT copyright 2022 American Medical Association. All rights reserved. The codes documented in this report are preliminary and upon coder  review may  be revised to meet current compliance requirements. Eather Colas MD, MD 11/26/2023 11:47:28 AM Number of Addenda: 0 Note Initiated On: 11/26/2023 11:13 AM Estimated Blood Loss:  Estimated blood loss was minimal.      [facility]

## 2023-11-26 NOTE — H&P (Signed)
Outpatient short stay form Pre-procedure 11/26/2023  Gregory Bill, MD  Primary Physician: Lauro Regulus, MD  Reason for visit:  Variceal screening/History of polyps  History of present illness:    72 y/o gentleman with history of cirrhosis (MASLD?), DM II, and hypertension here for EGD/Colonoscopy for variceal screening and history of polyps. No blood thinners. No family history of GI malignancies. No significant abdominal surgeries.    Current Facility-Administered Medications:    0.9 %  sodium chloride infusion, , Intravenous, Continuous, Abner Ardis, Rossie Muskrat, MD, Last Rate: 20 mL/hr at 11/26/23 1100, Continued from Pre-op at 11/26/23 1100  Medications Prior to Admission  Medication Sig Dispense Refill Last Dose/Taking   atorvastatin (LIPITOR) 20 MG tablet Take 20 mg by mouth daily.   11/25/2023   insulin degludec (TRESIBA) 200 UNIT/ML FlexTouch Pen Inject 40 Units into the skin every evening.    11/25/2023   losartan (COZAAR) 50 MG tablet Take 50 mg by mouth daily.   11/25/2023   pantoprazole (PROTONIX) 40 MG tablet Take 40 mg by mouth daily.   11/25/2023   vitamin B-12 (CYANOCOBALAMIN) 1000 MCG tablet Take 1,000 mcg by mouth daily.   11/25/2023   Dulaglutide (TRULICITY) 3 MG/0.5ML SOPN Inject into the skin.   11/16/2023   ibuprofen (ADVIL,MOTRIN) 200 MG tablet Take 200 mg by mouth every 6 (six) hours as needed.      metFORMIN (GLUMETZA) 500 MG (MOD) 24 hr tablet Take 2,000 mg by mouth daily with lunch.   11/24/2023     Allergies  Allergen Reactions   Bee Venom Swelling   Empagliflozin Other (See Comments)    Frequent urination- jardiance     Past Medical History:  Diagnosis Date   Abnormal LFTs    Arthritis    osteoarthritis   Coronary artery disease    Diabetes mellitus without complication (HCC)    type 2   Fever blister    GERD (gastroesophageal reflux disease)    Gout    Hemochromatosis    Hyperlipidemia    Hypertension    Liver cirrhosis (HCC)     Peripheral vascular disease (HCC)     Review of systems:  Otherwise negative.    Physical Exam  Gen: Alert, oriented. Appears stated age.  HEENT: PERRLA. Lungs: No respiratory distress CV: RRR Abd: soft, benign, no masses Ext: No edema    Planned procedures: Proceed with EGD/colonoscopy. The patient understands the nature of the planned procedure, indications, risks, alternatives and potential complications including but not limited to bleeding, infection, perforation, damage to internal organs and possible oversedation/side effects from anesthesia. The patient agrees and gives consent to proceed.  Please refer to procedure notes for findings, recommendations and patient disposition/instructions.     Gregory Bill, MD [facility]

## 2023-11-26 NOTE — Transfer of Care (Signed)
Immediate Anesthesia Transfer of Care Note  Patient: Gregory Cruz.  Procedure(s) Performed: COLONOSCOPY WITH PROPOFOL ESOPHAGOGASTRODUODENOSCOPY (EGD) WITH PROPOFOL BIOPSY POLYPECTOMY  Patient Location: Endoscopy Unit  Anesthesia Type:General  Level of Consciousness: drowsy and patient cooperative  Airway & Oxygen Therapy: Patient Spontanous Breathing and Patient connected to face mask oxygen  Post-op Assessment: Report given to RN and Post -op Vital signs reviewed and stable  Post vital signs: Reviewed and stable  Last Vitals:  Vitals Value Taken Time  BP 108/74 11/26/23 1147  Temp 36.3 C 11/26/23 1147  Pulse 61 11/26/23 1148  Resp 10 11/26/23 1148  SpO2 100 % 11/26/23 1148  Vitals shown include unfiled device data.  Last Pain:  Vitals:   11/26/23 1147  TempSrc: Temporal  PainSc: Asleep         Complications: No notable events documented.

## 2023-11-27 ENCOUNTER — Encounter: Payer: Self-pay | Admitting: Gastroenterology

## 2023-11-27 LAB — SURGICAL PATHOLOGY

## 2023-11-28 NOTE — Anesthesia Postprocedure Evaluation (Signed)
Anesthesia Post Note  Patient: Erasmo Dimarzio.  Procedure(s) Performed: COLONOSCOPY WITH PROPOFOL ESOPHAGOGASTRODUODENOSCOPY (EGD) WITH PROPOFOL BIOPSY POLYPECTOMY  Patient location during evaluation: Endoscopy Anesthesia Type: General Level of consciousness: awake and alert Pain management: pain level controlled Vital Signs Assessment: post-procedure vital signs reviewed and stable Respiratory status: spontaneous breathing, nonlabored ventilation, respiratory function stable and patient connected to nasal cannula oxygen Cardiovascular status: blood pressure returned to baseline and stable Postop Assessment: no apparent nausea or vomiting Anesthetic complications: no   No notable events documented.   Last Vitals:  Vitals:   11/26/23 1157 11/26/23 1207  BP: 110/75 125/83  Pulse: (!) 56 (!) 58  Resp: 12 11  Temp: (!) 36.3 C   SpO2: 100% 99%    Last Pain:  Vitals:   11/27/23 0729  TempSrc:   PainSc: 0-No pain                 Lenard Simmer
